# Patient Record
Sex: Male | Born: 1940 | Race: White | Hispanic: No | Marital: Married
Health system: Southern US, Community
[De-identification: ages and names within clinical notes are randomized; demographics above are authoritative.]

---

## 2020-01-21 ENCOUNTER — Inpatient Hospital Stay: Admit: 2020-01-21 | Primary: Internal Medicine

## 2020-03-17 ENCOUNTER — Ambulatory Visit
Admit: 2020-03-17 | Discharge: 2020-03-17 | Payer: MEDICARE | Attending: Cardiovascular Disease | Primary: Internal Medicine

## 2020-03-17 ENCOUNTER — Ambulatory Visit: Attending: Cardiovascular Disease | Primary: Internal Medicine

## 2020-03-17 DIAGNOSIS — I499 Cardiac arrhythmia, unspecified: Secondary | ICD-10-CM

## 2020-03-17 NOTE — Progress Notes (Signed)
[facility]  [address], [address]  [place] [address]  PHONE: [phone]    SUBJECTIVE: /HPI  Jerry S Dowse Jr. (07/30/1941) is a 79 y.o. male seen for a follow up visit regarding the following: Pt presents to the office c/o having "heart fluttering". Pt reports this has been a distant issue and is currently not having problems. States "feels like a muscle to me". Denies having any chest pain, shob.     Pt was seen by primary care for having L sided vision changes and R sided jaw discomfort, seen by [facility], got temporal artery biopsy. On prednisone 60 mg. Pt is concerned for carotid artery stenosis. Denies having numbness, focal weakness, slurred speech or facial droop.       ICD-10-CM ICD-9-CM   1. Irregular heart beat  I49.9 427.9   2. Palpitations  R00.2 785.1   3. Dyspnea on exertion  R06.00 786.09     Past Medical History, Past Surgical History, Family history, Social History, and Medications were all reviewed with the patient today and updated as necessary.    Outpatient Medications Marked as Taking for the 03/17/20 encounter (Office Visit) with Neelie Welshans M, MD   Medication Sig Dispense Refill   ??? predniSONE (DELTASONE) 20 mg tablet Take 60 mg by mouth daily.       No Known Allergies  No past medical history on file.  No past surgical history on file.  No family history on file.   Social History     Tobacco Use   ??? Smoking status: Former Smoker     Quit date: 1970     Years since quitting: 51.2   ??? Smokeless tobacco: Never Used   Substance Use Topics   ??? Alcohol use: Not on file     @JBFAM@    Current Outpatient Medications:   ???  predniSONE (DELTASONE) 20 mg tablet, Take 60 mg by mouth daily., Disp: , Rfl:         ROS:  Review of Systems - General ROS: negative for - chills, fatigue or fever  Hematological and Lymphatic ROS: negative for - bleeding problems, bruising or jaundice  Respiratory ROS: no cough, shortness of breath, or wheezing  Cardiovascular ROS: no chest pain or  dyspnea on exertion  Gastrointestinal ROS: no abdominal pain, change in bowel habits, or black or bloody stools  Neurological ROS: no TIA or stroke symptoms  All other systems negative.      Wt Readings from Last 3 Encounters:   03/17/20 146 lb (66.2 kg)     Temp Readings from Last 3 Encounters:   No data found for Temp     BP Readings from Last 3 Encounters:   03/17/20 (!) 140/78     Pulse Readings from Last 3 Encounters:   03/17/20 68           PHYSICAL EXAM:  Visit Vitals  BP (!) 140/78   Pulse 68   Ht 5' 8" (1.727 m)   Wt 146 lb (66.2 kg)   BMI 22.20 kg/m??       Physical Examination: General appearance - alert, well appearing, and in no distress  Mental status - alert, oriented to person, place, and time  Eyes - pupils equal and reactive, extraocular eye movements intact  Neck/lymph - supple, no significant adenopathy, no carotid bruit  Chest/lungs - clear to auscultation, no wheezes, rales or rhonchi, symmetric air entry  Heart/CV - normal rate, regular rhythm, normal S1,   S2, no murmurs, rubs, clicks or gallops  Abdomen/GI - soft, nontender, nondistended, no masses or organomegaly  Musculoskeletal - no joint tenderness, deformity or swelling  Extremities - peripheral pulses normal, no pedal edema, no clubbing or cyanosis  Skin - normal coloration and turgor, no rashes, no suspicious skin lesions noted    EKG: normal EKG, normal sinus rhythm, unchanged from previous tracings.                   Medications reviewed and questions answered    No results found for this or any previous visit (from the past 672 hour(s)).  No results found for: CHOL, CHOLPOCT, CHOLX, CHLST, CHOLV, HDL, HDLPOC, HDLP, LDL, LDLCPOC, LDLC, DLDLP, VLDLC, VLDL, TGLX, TRIGL, TRIGP, TGLPOCT, CHHD, CHHDX      ASSESSMENT and PLAN        1. Irregular heart beat  CBC, CMP, MG, TSH, echo, monitor.  - AMB POC EKG ROUTINE W/ 12 LEADS, INTER & REP    2. Vision changes,  -Currently on prednisone 60mg  by [facility], pending temporal artery biopsy, no  carotid bruit bt patient expressed concern for carotid artery stenosis, can perform carotid .     kardia mobile for palpitations  Stress echo for cp  Carotid US for CAS      Orders Placed This Encounter   ??? DOPPLER COLOR FLOW VELOCITY [place]     Standing Status:   Future     Standing Expiration Date:   09/13/2020   ??? DUPLEX CAROTID BILATERAL AMB NEURO     Standing Status:   Future     Standing Expiration Date:   03/18/2021     Order Specific Question:   Reason for Exam:     Answer:   cas   ??? STRESS ECHO ICLUD PERFORM ECG MONT WITH 03/20/2021     Standing Status:   Future     Standing Expiration Date:   03/17/2021     Order Specific Question:   Reason for Exam:     Answer:   See diagnosis   ??? DOPPLER ECHO HEART,[id number]     Standing Status:   Future     Standing Expiration Date:   09/13/2020     Order Specific Question:   Reason for Exam:     Answer:   See diagnosis   ??? AMB POC EKG ROUTINE W/ 12 LEADS, INTER & REP     Order Specific Question:   Reason for Exam:     Answer:   See diagnosis   ??? predniSONE (DELTASONE) 20 mg tablet     Sig: Take 60 mg by mouth daily.       Pt is instructed to follow all appropriate cardiac risk factor recommendations and to be compliant with meds and testing. Instructed to follow up appropriately and seek urgent medical care if acute or unstable issues arise. Results of some tests may be viewed thru MyChart but this does not substitute for follow up with MD. If follow up is not scheduled pt is instructed to call for follow up      Follow-up and Dispositions    ?? Return for after testing.               09/15/2020, MD  03/17/2020  11:17 AM

## 2020-03-17 NOTE — Progress Notes (Addendum)
[facility]  [address], [address]  [place] [address]  PHONE: [phone]    SUBJECTIVE: /HPI  Jerry S Mozley Jr. (04/06/1941) is a 79 y.o. male seen for a follow up visit regarding the following: Pt presents to the office c/o having "heart fluttering". Pt reports this has been a distant issue and is currently not having problems. States "feels like a muscle to me". Denies having any chest pain, shob.     Pt was seen by primary care for having L sided vision changes and R sided jaw discomfort, seen by [facility], got temporal artery biopsy. On prednisone 60 mg. Pt is concerned for carotid artery stenosis. Denies having numbness, focal weakness, slurred speech or facial droop.       ICD-10-CM ICD-9-CM   1. Irregular heart beat  I49.9 427.9   2. Palpitations  R00.2 785.1   3. Dyspnea on exertion  R06.00 786.09     Past Medical History, Past Surgical History, Family history, Social History, and Medications were all reviewed with the patient today and updated as necessary.    Outpatient Medications Marked as Taking for the 03/17/20 encounter (Office Visit) with Bittrick, Jon M, MD   Medication Sig Dispense Refill   ??? predniSONE (DELTASONE) 20 mg tablet Take 60 mg by mouth daily.       No Known Allergies  No past medical history on file.  No past surgical history on file.  No family history on file.   Social History     Tobacco Use   ??? Smoking status: Former Smoker     Quit date: 1970     Years since quitting: 51.2   ??? Smokeless tobacco: Never Used   Substance Use Topics   ??? Alcohol use: Not on file     @JBFAM@    Current Outpatient Medications:   ???  predniSONE (DELTASONE) 20 mg tablet, Take 60 mg by mouth daily., Disp: , Rfl:         ROS:  Review of Systems - General ROS: negative for - chills, fatigue or fever  Hematological and Lymphatic ROS: negative for - bleeding problems, bruising or jaundice  Respiratory ROS: no cough, shortness of breath, or wheezing  Cardiovascular ROS: no chest pain or  dyspnea on exertion  Gastrointestinal ROS: no abdominal pain, change in bowel habits, or black or bloody stools  Neurological ROS: no TIA or stroke symptoms  All other systems negative.      Wt Readings from Last 3 Encounters:   03/17/20 146 lb (66.2 kg)     Temp Readings from Last 3 Encounters:   No data found for Temp     BP Readings from Last 3 Encounters:   03/17/20 (!) 140/78     Pulse Readings from Last 3 Encounters:   03/17/20 68           PHYSICAL EXAM:  Visit Vitals  BP (!) 140/78   Pulse 68   Ht 5' 8" (1.727 m)   Wt 146 lb (66.2 kg)   BMI 22.20 kg/m??       Physical Examination: General appearance - alert, well appearing, and in no distress  Mental status - alert, oriented to person, place, and time  Eyes - pupils equal and reactive, extraocular eye movements intact  Neck/lymph - supple, no significant adenopathy, no carotid bruit  Chest/lungs - clear to auscultation, no wheezes, rales or rhonchi, symmetric air entry  Heart/CV - normal rate, regular rhythm, normal S1,   S2, no murmurs, rubs, clicks or gallops  Abdomen/GI - soft, nontender, nondistended, no masses or organomegaly  Musculoskeletal - no joint tenderness, deformity or swelling  Extremities - peripheral pulses normal, no pedal edema, no clubbing or cyanosis  Skin - normal coloration and turgor, no rashes, no suspicious skin lesions noted    EKG: normal EKG, normal sinus rhythm, unchanged from previous tracings.                   Medications reviewed and questions answered    No results found for this or any previous visit (from the past 672 hour(s)).  No results found for: CHOL, CHOLPOCT, CHOLX, CHLST, CHOLV, HDL, HDLPOC, HDLP, LDL, LDLCPOC, LDLC, DLDLP, VLDLC, VLDL, TGLX, TRIGL, TRIGP, TGLPOCT, CHHD, CHHDX      ASSESSMENT and PLAN        1. Irregular heart beat  CBC, CMP, MG, TSH, echo, monitor.  - AMB POC EKG ROUTINE W/ 12 LEADS, INTER & REP    2. Vision changes,  -Currently on prednisone 60mg  by [facility], pending temporal artery biopsy, no  carotid bruit bt patient expressed concern for carotid artery stenosis, can perform carotid .     kardia mobile for palpitations  Stress echo for cp  Carotid US for CAS      Orders Placed This Encounter   ??? DOPPLER COLOR FLOW VELOCITY [place]     Standing Status:   Future     Standing Expiration Date:   09/13/2020   ??? DUPLEX CAROTID BILATERAL AMB NEURO     Standing Status:   Future     Standing Expiration Date:   03/18/2021     Order Specific Question:   Reason for Exam:     Answer:   cas   ??? STRESS ECHO ICLUD PERFORM ECG [facility]ONT WITH 03/20/2021     Standing Status:   Future     Standing Expiration Date:   03/17/2021     Order Specific Question:   Reason for Exam:     Answer:   See diagnosis   ??? DOPPLER ECHO HEART,[id number]     Standing Status:   Future     Standing Expiration Date:   09/13/2020     Order Specific Question:   Reason for Exam:     Answer:   See diagnosis   ??? AMB POC EKG ROUTINE W/ 12 LEADS, INTER & REP     Order Specific Question:   Reason for Exam:     Answer:   See diagnosis   ??? predniSONE (DELTASONE) 20 mg tablet     Sig: Take 60 mg by mouth daily.       Pt is instructed to follow all appropriate cardiac risk factor recommendations and to be compliant with meds and testing. Instructed to follow up appropriately and seek urgent medical care if acute or unstable issues arise. Results of some tests may be viewed thru MyChart but this does not substitute for follow up with MD. If follow up is not scheduled pt is instructed to call for follow up      Follow-up and Dispositions    ?? Return for after testing.               09/15/2020, MD  03/17/2020  11:17 AM

## 2020-03-17 NOTE — Progress Notes (Signed)
 [facility]  [address], [address]  [place], [place] [address]  PHONE: [phone]    SUBJECTIVE: Jerry Mendoza. (01/21/1941) is a 79 y.o. male seen for a follow up visit regarding the following: Pt presents to the office c/o having heart fluttering. Pt reports this has been a distant issue and is currently not having problems. States feels like a muscle to me. Denies having any chest pain, shob.     Pt was seen by primary care for having L sided vision changes and R sided jaw discomfort, seen by [name], got temporal artery biopsy. On prednisone 60 mg. Pt is concerned for carotid artery stenosis. Denies having numbness, focal weakness, slurred speech or facial droop.       ICD-10-CM ICD-9-CM   1. Irregular heart beat  I49.9 427.9   2. Palpitations  R00.2 785.1   3. Dyspnea on exertion  R06.00 786.09     Past Medical History, Past Surgical History, Family history, Social History, and Medications were all reviewed with the patient today and updated as necessary.    Outpatient Medications Marked as Taking for the 03/17/20 encounter (Office Visit) with Bittrick, Jerry HERO, MD   Medication Sig Dispense Refill   . predniSONE (DELTASONE) 20 mg tablet Take 60 mg by mouth daily.       No Known Allergies  No past medical history on file.  No past surgical history on file.  No family history on file.   Social History     Tobacco Use   . Smoking status: Former Smoker     Quit date: 1970     Years since quitting: 51.2   . Smokeless tobacco: Never Used   Substance Use Topics   . Alcohol use: Not on file     @JBFAM @    Current Outpatient Medications:   .  predniSONE (DELTASONE) 20 mg tablet, Take 60 mg by mouth daily., Disp: , Rfl:         ROS:  Review of Systems - General ROS: negative for - chills, fatigue or fever  Hematological and Lymphatic ROS: negative for - bleeding problems, bruising or jaundice  Respiratory ROS: no cough, shortness of breath, or wheezing  Cardiovascular ROS: no chest pain or  dyspnea on exertion  Gastrointestinal ROS: no abdominal pain, change in bowel habits, or black or bloody stools  Neurological ROS: no TIA or stroke symptoms  All other systems negative.      Wt Readings from Last 3 Encounters:   03/17/20 146 lb (66.2 kg)     Temp Readings from Last 3 Encounters:   No data found for Temp     BP Readings from Last 3 Encounters:   03/17/20 (!) 140/78     Pulse Readings from Last 3 Encounters:   03/17/20 68           PHYSICAL EXAM:  Visit Vitals  BP (!) 140/78   Pulse 68   Ht 5' 8 (1.727 m)   Wt 146 lb (66.2 kg)   BMI 22.20 kg/m       Physical Examination: General appearance - alert, well appearing, and in no distress  Mental status - alert, oriented to person, place, and time  Eyes - pupils equal and reactive, extraocular eye movements intact  Neck/lymph - supple, no significant adenopathy, no carotid bruit  Chest/lungs - clear to auscultation, no wheezes, rales or rhonchi, symmetric air entry  Heart/CV - normal rate, regular rhythm, normal S1,  S2, no murmurs, rubs, clicks or gallops  Abdomen/GI - soft, nontender, nondistended, no masses or organomegaly  Musculoskeletal - no joint tenderness, deformity or swelling  Extremities - peripheral pulses normal, no pedal edema, no clubbing or cyanosis  Skin - normal coloration and turgor, no rashes, no suspicious skin lesions noted    EKG: normal EKG, normal sinus rhythm, unchanged from previous tracings.                   Medications reviewed and questions answered    No results found for this or any previous visit (from the past 672 hour(s)).  No results found for: CHOL, CHOLPOCT, CHOLX, CHLST, CHOLV, HDL, HDLPOC, HDLP, LDL, LDLCPOC, LDLC, DLDLP, VLDLC, VLDL, TGLX, TRIGL, TRIGP, TGLPOCT, CHHD, CHHDX      ASSESSMENT and PLAN        1. Irregular heart beat  CBC, CMP, MG, TSH, echo, monitor.  - AMB POC EKG ROUTINE W/ 12 LEADS, INTER & REP    2. Vision changes,  -Currently on prednisone 60mg  by [facility], pending temporal artery biopsy, no  carotid bruit bt patient expressed concern for carotid artery stenosis, can perform carotid US .     kardia mobile for palpitations  Stress echo for cp  Carotid US  for CAS      Orders Placed This Encounter   . DOPPLER COLOR FLOW VELOCITY [id number]     Standing Status:   Future     Standing Expiration Date:   09/13/2020   . DUPLEX CAROTID BILATERAL AMB NEURO     Standing Status:   Future     Standing Expiration Date:   03/18/2021     Order Specific Question:   Reason for Exam:     Answer:   cas   . STRESS ECHO ICLUD PERFORM ECG MONT WITH [id number]     Standing Status:   Future     Standing Expiration Date:   03/17/2021     Order Specific Question:   Reason for Exam:     Answer:   See diagnosis   . DOPPLER ECHO HEART,[id number]     Standing Status:   Future     Standing Expiration Date:   09/13/2020     Order Specific Question:   Reason for Exam:     Answer:   See diagnosis   . AMB POC EKG ROUTINE W/ 12 LEADS, INTER & REP     Order Specific Question:   Reason for Exam:     Answer:   See diagnosis   . predniSONE (DELTASONE) 20 mg tablet     Sig: Take 60 mg by mouth daily.       Pt is instructed to follow all appropriate cardiac risk factor recommendations and to be compliant with meds and testing. Instructed to follow up appropriately and seek urgent medical care if acute or unstable issues arise. Results of some tests may be viewed thru MyChart but this does not substitute for follow up with MD. If follow up is not scheduled pt is instructed to call for follow up      Follow-up and Dispositions     Return for after testing.               Jerry CHRISTELLA Parcel, MD  03/17/2020  11:17 AM

## 2020-03-17 NOTE — Progress Notes (Signed)
[facility]  [address], [address]  [place] [address]  PHONE: [phone]    SUBJECTIVE: /HPI  Jerry S Avilla Jr. (12/04/1941) is a 79 y.o. male seen for a follow up visit regarding the following: Pt presents to the office c/o having "heart fluttering". Pt reports this has been a distant issue and is currently not having problems. States "feels like a muscle to me". Denies having any chest pain, shob.     Pt was seen by primary care for having L sided vision changes and R sided jaw discomfort, seen by [facility], got temporal artery biopsy. On prednisone 60 mg. Pt is concerned for carotid artery stenosis. Denies having numbness, focal weakness, slurred speech or facial droop.       ICD-10-CM ICD-9-CM   1. Irregular heart beat  I49.9 427.9   2. Palpitations  R00.2 785.1   3. Dyspnea on exertion  R06.00 786.09     Past Medical History, Past Surgical History, Family history, Social History, and Medications were all reviewed with the patient today and updated as necessary.    Outpatient Medications Marked as Taking for the 03/17/20 encounter (Office Visit) with Codi Kertz M, MD   Medication Sig Dispense Refill   ??? predniSONE (DELTASONE) 20 mg tablet Take 60 mg by mouth daily.       No Known Allergies  No past medical history on file.  No past surgical history on file.  No family history on file.   Social History     Tobacco Use   ??? Smoking status: Former Smoker     Quit date: 1970     Years since quitting: 51.2   ??? Smokeless tobacco: Never Used   Substance Use Topics   ??? Alcohol use: Not on file     @JBFAM@    Current Outpatient Medications:   ???  predniSONE (DELTASONE) 20 mg tablet, Take 60 mg by mouth daily., Disp: , Rfl:         ROS:  Review of Systems - General ROS: negative for - chills, fatigue or fever  Hematological and Lymphatic ROS: negative for - bleeding problems, bruising or jaundice  Respiratory ROS: no cough, shortness of breath, or wheezing  Cardiovascular ROS: no chest pain or  dyspnea on exertion  Gastrointestinal ROS: no abdominal pain, change in bowel habits, or black or bloody stools  Neurological ROS: no TIA or stroke symptoms  All other systems negative.      Wt Readings from Last 3 Encounters:   03/17/20 146 lb (66.2 kg)     Temp Readings from Last 3 Encounters:   No data found for Temp     BP Readings from Last 3 Encounters:   03/17/20 (!) 140/78     Pulse Readings from Last 3 Encounters:   03/17/20 68           PHYSICAL EXAM:  Visit Vitals  BP (!) 140/78   Pulse 68   Ht 5' 8" (1.727 m)   Wt 146 lb (66.2 kg)   BMI 22.20 kg/m??       Physical Examination: General appearance - alert, well appearing, and in no distress  Mental status - alert, oriented to person, place, and time  Eyes - pupils equal and reactive, extraocular eye movements intact  Neck/lymph - supple, no significant adenopathy, no carotid bruit  Chest/lungs - clear to auscultation, no wheezes, rales or rhonchi, symmetric air entry  Heart/CV - normal rate, regular rhythm, normal S1,   S2, no murmurs, rubs, clicks or gallops  Abdomen/GI - soft, nontender, nondistended, no masses or organomegaly  Musculoskeletal - no joint tenderness, deformity or swelling  Extremities - peripheral pulses normal, no pedal edema, no clubbing or cyanosis  Skin - normal coloration and turgor, no rashes, no suspicious skin lesions noted    EKG: normal EKG, normal sinus rhythm, unchanged from previous tracings.                   Medications reviewed and questions answered    No results found for this or any previous visit (from the past 672 hour(s)).  No results found for: CHOL, CHOLPOCT, CHOLX, CHLST, CHOLV, HDL, HDLPOC, HDLP, LDL, LDLCPOC, LDLC, DLDLP, VLDLC, VLDL, TGLX, TRIGL, TRIGP, TGLPOCT, CHHD, CHHDX      ASSESSMENT and PLAN        1. Irregular heart beat  CBC, CMP, MG, TSH, echo, monitor.  - AMB POC EKG ROUTINE W/ 12 LEADS, INTER & REP    2. Vision changes,  -Currently on prednisone 60mg  by [facility], pending temporal artery biopsy, no  carotid bruit bt patient expressed concern for carotid artery stenosis, can perform carotid .     kardia mobile for palpitations  Stress echo for cp  Carotid US for CAS      Orders Placed This Encounter   ??? DOPPLER COLOR FLOW VELOCITY [place]     Standing Status:   Future     Standing Expiration Date:   09/13/2020   ??? DUPLEX CAROTID BILATERAL AMB NEURO     Standing Status:   Future     Standing Expiration Date:   03/18/2021     Order Specific Question:   Reason for Exam:     Answer:   cas   ??? STRESS ECHO ICLUD PERFORM ECG MONT WITH 03/20/2021     Standing Status:   Future     Standing Expiration Date:   03/17/2021     Order Specific Question:   Reason for Exam:     Answer:   See diagnosis   ??? DOPPLER ECHO HEART,[id number]     Standing Status:   Future     Standing Expiration Date:   09/13/2020     Order Specific Question:   Reason for Exam:     Answer:   See diagnosis   ??? AMB POC EKG ROUTINE W/ 12 LEADS, INTER & REP     Order Specific Question:   Reason for Exam:     Answer:   See diagnosis   ??? predniSONE (DELTASONE) 20 mg tablet     Sig: Take 60 mg by mouth daily.       Pt is instructed to follow all appropriate cardiac risk factor recommendations and to be compliant with meds and testing. Instructed to follow up appropriately and seek urgent medical care if acute or unstable issues arise. Results of some tests may be viewed thru MyChart but this does not substitute for follow up with MD. If follow up is not scheduled pt is instructed to call for follow up      Follow-up and Dispositions    ?? Return for after testing.               09/15/2020, MD  03/17/2020  11:17 AM

## 2020-03-18 ENCOUNTER — Encounter: Payer: MEDICARE | Primary: Internal Medicine

## 2020-03-18 ENCOUNTER — Institutional Professional Consult (permissible substitution): Admit: 2020-03-18 | Payer: MEDICARE | Primary: Internal Medicine

## 2020-03-18 ENCOUNTER — Institutional Professional Consult (permissible substitution): Primary: Internal Medicine

## 2020-03-18 DIAGNOSIS — I499 Cardiac arrhythmia, unspecified: Secondary | ICD-10-CM

## 2020-03-18 NOTE — Progress Notes (Signed)
Carotid ultrasound completed.  See interpretation scanned to the order.

## 2020-03-19 ENCOUNTER — Encounter: Payer: MEDICARE | Primary: Internal Medicine

## 2020-03-25 ENCOUNTER — Institutional Professional Consult (permissible substitution): Admit: 2020-03-25 | Payer: MEDICARE | Primary: Internal Medicine

## 2020-03-25 ENCOUNTER — Institutional Professional Consult (permissible substitution): Primary: Internal Medicine

## 2020-03-25 DIAGNOSIS — I499 Cardiac arrhythmia, unspecified: Secondary | ICD-10-CM

## 2020-03-25 MED ORDER — PERFLUTREN LIPID MICROSPHERES 1.1 MG/ML IV
1.1 mg/mL | Freq: Once | INTRAVENOUS | Status: AC
Start: 2020-03-25 — End: 2020-03-25
  Administered 2020-03-25: 20:00:00 via INTRAVENOUS

## 2020-03-25 NOTE — Progress Notes (Signed)
Full Study Stress Echo. See interpretation scanned to the order.

## 2020-04-02 ENCOUNTER — Encounter: Primary: Internal Medicine

## 2020-05-08 ENCOUNTER — Encounter: Primary: Internal Medicine

## 2020-05-14 ENCOUNTER — Encounter: Attending: Cardiovascular Disease | Primary: Internal Medicine

## 2020-05-25 ENCOUNTER — Ambulatory Visit
Admit: 2020-05-25 | Discharge: 2020-05-25 | Payer: MEDICARE | Attending: Cardiovascular Disease | Primary: Internal Medicine

## 2020-05-25 ENCOUNTER — Ambulatory Visit: Attending: Cardiovascular Disease | Primary: Internal Medicine

## 2020-05-25 DIAGNOSIS — I499 Cardiac arrhythmia, unspecified: Secondary | ICD-10-CM

## 2020-05-25 NOTE — Progress Notes (Signed)
[place], [place]  [address], [address]  [place] [address]  PHONE: [phone]    SUBJECTIVE: Jerry Mendoza. (1941/09/24) is a 79 y.o. male seen for a follow up visit regarding the following: Pt presents to the office c/o having "heart fluttering". Pt reports this has been a distant issue and is currently not having problems. States "feels like a muscle to me". Denies having any chest pain, shob.     Pt was seen by primary care for having L sided vision changes and R sided jaw discomfort, seen by [facility], got temporal artery biopsy. On prednisone 60 mg. Pt is concerned for carotid artery stenosis. Denies having numbness, focal weakness, slurred speech or facial droop.  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 Irregular heart beat  I49.9 427.9   2. Palpitations  R00.2 785.1   3. Dyspnea on exertion  R06.00 786.09     Past Medical History, Past Surgical History, Family history, Social History, and Medications were all reviewed with the patient today and updated as necessary.  Patient has minimal plaque bilaterally on his carotid artery duplex baseline echo has normal LV function normal diastolic filling probably has a very small PFO stress portion of the echocardiogram is negative for inducible ischemia    Outpatient Medications Marked as Taking for the 05/25/20 encounter (Office Visit) with Darien Mignogna, Orland Penman, MD   Medication Sig Dispense Refill   ??? predniSONE (DELTASONE) 20 mg tablet Take as directed       No Known Allergies  No past medical history on file.  No past surgical history on file.  No family history on file.   Social History     Tobacco Use   ??? Smoking status: Former Smoker      Quit date: 1970     Years since quitting: 51.4   ??? Smokeless tobacco: Never Used   Substance Use Topics   ??? Alcohol use: Not on file     @JBFAM @    Current Outpatient Medications:   ???  predniSONE (DELTASONE) 20 mg tablet, Take as directed, Disp: , Rfl:         ROS:  Review of Systems - General ROS: negative for - chills, fatigue or fever  Hematological and Lymphatic ROS: negative for - bleeding problems, bruising or jaundice  Respiratory ROS: no cough, shortness of breath, or wheezing  Cardiovascular ROS: no chest pain or dyspnea on exertion  Gastrointestinal ROS: no abdominal pain, change in bowel habits, or black or bloody stools  Neurological ROS: no TIA or stroke symptoms  All other systems negative.      Wt Readings from Last 3 Encounters:   05/25/20 136 lb (61.7 kg)   03/17/20 146 lb (66.2 kg)     Temp Readings from Last 3 Encounters:   No data found for Temp     BP Readings from Last 3 Encounters:   05/25/20 130/66   03/17/20 (!) 140/78     Pulse Readings from Last 3 Encounters:   05/25/20 76   03/17/20 68           PHYSICAL EXAM:  Visit Vitals  BP 130/66   Pulse 76   Ht 5\' 8"  (1.727 m)   Wt 136 lb (61.7 kg)   BMI 20.68 kg/m??       Physical Examination: General appearance - alert, well appearing, and in no distress  Mental status - alert, oriented to person, place, and time  Eyes - pupils equal and reactive, extraocular eye movements intact  Neck/lymph - supple, no significant adenopathy, no carotid bruit  Chest/lungs - clear to auscultation, no wheezes, rales or rhonchi, symmetric air entry  Heart/CV - normal rate, regular rhythm, normal S1, S2, no murmurs, rubs, clicks or gallops  Abdomen/GI - soft, nontender, nondistended, no masses or organomegaly  Musculoskeletal - no joint tenderness, deformity or swelling  Extremities - peripheral pulses normal, no pedal edema, no clubbing or cyanosis  Skin - normal coloration and turgor, no rashes, no suspicious skin lesions noted    EKG: normal EKG, normal sinus  rhythm, unchanged from previous tracings.                   Medications reviewed and questions answered    No results found for this or any previous visit (from the past 672 hour(s)).  No results found for: CHOL, CHOLPOCT, CHOLX, CHLST, CHOLV, HDL, HDLPOC, HDLP, LDL, LDLCPOC, LDLC, DLDLP, VLDLC, VLDL, TGLX, TRIGL, TRIGP, TGLPOCT, CHHD, CHHDX      ASSESSMENT and PLAN        1. Irregular heart beat  CBC, CMP, MG, TSH, echo, monitor.  - AMB POC EKG ROUTINE W/ 12 LEADS, INTER & REP    2. Vision changes,  -Currently on prednisone 60mg  by [facility], pending temporal artery biopsy, no carotid bruit bt patient expressed concern for carotid artery stenosis, can perform carotid .        Patient appears to have non cardiac symptoms. No further cardiac workup indicated. Continue current meds and follow appropriate risk factor modifications and healthy lifestyle and dietary choices.      No orders of the defined types were placed in this encounter.      Pt is instructed to follow all appropriate cardiac risk factor recommendations and to be compliant with meds and testing. Instructed to follow up appropriately and seek urgent medical care if acute or unstable issues arise. Results of some tests may be viewed thru MyChart but this does not substitute for follow up with MD. If follow up is not scheduled pt is instructed to call for follow up                , MD  05/25/2020  11:17 AM

## 2020-12-01 ENCOUNTER — Other Ambulatory Visit: Payer: Self-pay

## 2020-12-01 ENCOUNTER — Ambulatory Visit: Payer: Medicare Other | Attending: Internal Medicine | Admitting: Physical Therapy

## 2020-12-01 ENCOUNTER — Encounter: Payer: Self-pay | Admitting: Physical Therapy

## 2020-12-01 DIAGNOSIS — M25612 Stiffness of left shoulder, not elsewhere classified: Secondary | ICD-10-CM

## 2020-12-01 DIAGNOSIS — M6281 Muscle weakness (generalized): Secondary | ICD-10-CM | POA: Diagnosis not present

## 2020-12-01 NOTE — Therapy (Signed)
[facility] [address], [address] [place], [place], [address] Phone: [phone]   Fax:  [phone]  Physical Therapy Evaluation  Patient Details  Name: Cory Richard. MRN: [id number] Date of Birth: [birth year] Referring Provider (PT): Renford Dills MD   Encounter Date: 12/01/2020   PT End of Session - 12/01/20 1146    Visit Number 1    Date for PT Re-Evaluation 01/26/21    Authorization Type MCR    Progress Note Due on Visit 10    PT Start Time 1146    PT Stop Time 1233    PT Time Calculation (min) 47 min    Activity Tolerance Patient tolerated treatment well    Behavior During Therapy [facility] for tasks assessed/performed           History reviewed. No pertinent past medical history.  History reviewed. No pertinent surgical history.  There were no vitals filed for this visit.    Subjective Assessment - 12/01/20 1151    Subjective About 4 months ago saw MD for left shoulder and was told the ball was trying to come out of the socket when he raised his arm OH. Additionally he is here for overall weakness. 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 Now is at 2 mg/day. Methotrexate 1x/week. 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 Taking Calcium and vit D3 also. He went off his OP meds due to back pain it caused. His biggest c/o is trying to perform OH ADLs. Still walks weekly. He gets occassional pain with moving shoulders but it's mainly weakness.    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    Currently in Pain? No/denies              [facility] PT Assessment - 12/01/20 0001      Assessment   Medical Diagnosis physical deconditioning    Referring Provider (PT) Renford Dills MD    Onset Date/Surgical Date 05/19/20    Hand  Dominance Right    Next MD Visit March 2022    Prior Therapy for left RC      Precautions   Precautions Other (comment)    Precaution Comments osteoporosis      Restrictions   Weight Bearing Restrictions No      Balance Screen   Has the patient fallen in the past 6 months No    Has the patient had a decrease in activity level because of a fear of falling?  No    Is the patient reluctant to leave their home because of a fear of falling?  No      Home Tourist information centre manager residence    Living Arrangements Spouse/significant other      Prior Function   Level of Independence Independent    Vocation Retired    Leisure gym, walking, would like to get back to Training and development officer Comments bil winging scapula Rt > Lt, depressed right scapula/tight right lumbar      ROM / Strength   AROM / PROM / Strength AROM;PROM;Strength      AROM   Overall AROM Comments left cervical rotaton and bil SB tight but functional    AROM Assessment Site Shoulder  Right/Left Shoulder Right;Left    Right Shoulder Extension --   full   Right Shoulder Flexion 155 Degrees   standing   Right Shoulder ABduction --   [facility]   Right Shoulder Internal Rotation --   able to reach behind back without difficulty; WFL in supine   Right Shoulder External Rotation 90 Degrees    Left Shoulder Extension --   [facility]   Left Shoulder Flexion 145 Degrees   standing   Left Shoulder ABduction --   [facility]   Left Shoulder Internal Rotation --   able to reach behind back without difficulty; WFL in supine   Left Shoulder External Rotation 90 Degrees      PROM   PROM Assessment Site Shoulder    Right/Left Shoulder Right;Left    Right Shoulder Flexion 170 Degrees   standing   Left Shoulder Flexion 165 Degrees   standing     Strength   Overall Strength Comments bil hip flex 4+/5, right hip ext and ABD 5/5, left ext and ABD 4+/5    Strength Assessment Site Shoulder    Right/Left  Shoulder Right;Left    Right Shoulder Flexion 5/5    Right Shoulder Extension 5/5    Right Shoulder ABduction 4-/5    Right Shoulder Internal Rotation 4/5    Right Shoulder External Rotation 4-/5    Left Shoulder Flexion 5/5    Left Shoulder Extension 5/5    Left Shoulder ABduction 4-/5    Left Shoulder Internal Rotation 4/5    Left Shoulder External Rotation 3+/5      Flexibility   Soft Tissue Assessment /Muscle Length --   tight pectorals     Palpation   Palpation comment unremarkable      Special Tests   Other special tests neg special tests                      Objective measurements completed on examination: See above findings.       [facility] Adult PT Treatment/Exercise - 12/01/20 0001      Exercises   Exercises Shoulder      Shoulder Exercises: Supine   Horizontal ABduction Both;10 reps    Theraband Level (Shoulder Horizontal ABduction) Level 3 (Green)      Shoulder Exercises: Seated   External Rotation Both;10 reps      Shoulder Exercises: Standing   Extension 10 reps    Theraband Level (Shoulder Extension) Level 3 (Green)    Row 10 reps    Theraband Level (Shoulder Row) Level 3 Chilton Si)                  PT Education - 12/01/20 1238    Education Details HEP    Person(s) Educated Patient    Methods Explanation;Demonstration;Handout    Comprehension Verbalized understanding;Returned demonstration            PT Short Term Goals - 12/01/20 1253      PT SHORT TERM GOAL #1   Title Ind with initial HEP    Time 3    Period Weeks    Status New    Target Date 12/22/20      PT SHORT TERM GOAL #2   Title Pt to understand and verbalize importance of weightbearing activities in the treatment of osteoporosis.    Time 3    Period Weeks    Status New             PT Long Term Goals -  12/01/20 1253      PT LONG TERM GOAL #1   Title Improved bil shoulder strength to 4+/5 or better to normalize ADLS    Time 8    Period Weeks     Status New    Target Date 01/26/21      PT LONG TERM GOAL #2   Title Patient to report overall improvement of strength with ADLS by 75% or more.    Time 8    Period Weeks    Status New      PT LONG TERM GOAL #3   Title improved bil hip strength to 5/5 to improve function.    Time 8    Period Weeks    Status New      PT LONG TERM GOAL #4   Title ind with HEP to maintain strength gains in therapy.    Time 8    Period Weeks    Status New      PT LONG TERM GOAL #5   Title improved left shoulder flexion in standing to 155 deg or greater to ease OH activities.    Time 8    Period Weeks    Status New                  Plan - 12/01/20 1238    Clinical Impression Statement Patient presents with c/o of bil shoulder weakness since June 2021 limiting ADLS and overall weakness since being diagnosed with giant cell arteritis in April 2021. He does report occasional pain. He has also been recently diagnosed with osteoporosis he believes from the Prednisone regimen he's been on since April. Bil shoulders are WFL, but active left shoulder flexion in standing is only 145 deg. Bil shoulder strength is significantly limited in ER and ABD as well. He also has weakkness in bil hips left > right. Posturally, he has bil winging of his scapula indicating weakness and depression of right shoulder and scapula. He will benefit from PT to address these deficits.    Personal Factors and Comorbidities Comorbidity 2;Comorbidity 3+    Comorbidities giant cell arteritis, OP, Rt RCR    Examination-Activity Limitations Lift;Reach Overhead    Stability/Clinical Decision Making Stable/Uncomplicated    Rehab Potential Excellent    PT Frequency 2x / week    PT Duration 8 weeks    PT Treatment/Interventions ADLs/Self Care Home Management;Cryotherapy;Moist Heat;Therapeutic activities;Therapeutic exercise;Neuromuscular re-education;Manual techniques;Patient/family education;Dry needling    PT Next Visit Plan Bil  shoulder strengthening, serratus ant and mid/low trap strength, hip strengthening; osteoporosis ed    PT Home Exercise Plan [email or web address]    Consulted and Agree with Plan of Care Patient           Patient will benefit from skilled therapeutic intervention in order to improve the following deficits and impairments:  Decreased range of motion,Postural dysfunction,Decreased strength,Impaired flexibility,Impaired UE functional use  Visit Diagnosis: Muscle weakness (generalized) - Plan: PT plan of care cert/re-cert  Stiffness of left shoulder, not elsewhere classified - Plan: PT plan of care cert/re-cert     Problem List There are no problems to display for this patient.   Raynelle Fanning Deago Burruss PT 12/01/2020, 1:10 PM  [facility] [address] [place], [place], [address] Phone: [phone]   Fax:  [phone]  Name: Cory Richard. MRN: [id number] Date of Birth: 20-Sep-1941

## 2020-12-01 NOTE — Patient Instructions (Signed)
Access Code: [email or web address] URL: [email or web address] Date: 12/01/2020 Prepared by: Raynelle Fanning  Exercises Seated Shoulder External Rotation - 1 x daily - 7 x weekly - 3 sets - 10 reps Standing Bilateral Low Shoulder Row with Anchored Resistance - 1 x daily - 7 x weekly - 3 sets - 10 reps Shoulder Extension with Resistance - 1 x daily - 7 x weekly - 3 sets - 10 reps Supine Shoulder Horizontal Abduction with Resistance - 1 x daily - 7 x weekly - 3 sets - 10 reps

## 2020-12-07 ENCOUNTER — Encounter: Payer: Self-pay | Admitting: Physical Therapy

## 2020-12-09 ENCOUNTER — Encounter: Payer: Self-pay | Admitting: Physical Therapy

## 2020-12-14 ENCOUNTER — Ambulatory Visit: Payer: Medicare Other

## 2020-12-14 ENCOUNTER — Other Ambulatory Visit: Payer: Self-pay

## 2020-12-14 DIAGNOSIS — M6281 Muscle weakness (generalized): Secondary | ICD-10-CM

## 2020-12-14 DIAGNOSIS — M25612 Stiffness of left shoulder, not elsewhere classified: Secondary | ICD-10-CM

## 2020-12-14 NOTE — Therapy (Signed)
[facility] [address] [place], [place], [address] Phone: [phone]   Fax:  [phone]  Physical Therapy Treatment  Patient Details  Name: Cory Richard. MRN: [id number] Date of Birth: 1941-08-10 Referring Provider (PT): Renford Dills MD   Encounter Date: 12/14/2020   PT End of Session - 12/14/20 1015    Visit Number 2    Date for PT Re-Evaluation 01/26/21    Authorization Type MCR    Progress Note Due on Visit 10    PT Start Time 0930    PT Stop Time 1013    PT Time Calculation (min) 43 min    Activity Tolerance Patient tolerated treatment well    Behavior During Therapy [facility] for tasks assessed/performed           History reviewed. No pertinent past medical history.  History reviewed. No pertinent surgical history.  There were no vitals filed for this visit.                      [facility] Adult PT Treatment/Exercise - 12/14/20 0001      Exercises   Exercises Shoulder      Shoulder Exercises: Supine   Horizontal ABduction Both;20 reps    Theraband Level (Shoulder Horizontal ABduction) Level 3 (Green)    External Rotation Strengthening;Both;Theraband;10 reps    Theraband Level (Shoulder External Rotation) Level 1 (Yellow)    External Rotation Limitations stabilize with Lt and ER with Rt.  Not enough Lt shoulder strength for this active motion      Shoulder Exercises: Seated   External Rotation Both;10 reps      Shoulder Exercises: Sidelying   External Rotation Strengthening;Left;20 reps      Shoulder Exercises: Standing   Extension 20 reps;Theraband    Theraband Level (Shoulder Extension) Level 3 (Green)    Row 20 reps;Theraband    Theraband Level (Shoulder Row) Level 3 (Green)      Shoulder Exercises: ROM/Strengthening   UBE (Upper Arm Bike) Level 1x7 minutes (forward and reverse) PT present to discuss progress                  PT Education - 12/14/20 1007     Education Details Access Code: [email or web address], osteoporosis education    Person(s) Educated Patient    Methods Explanation;Demonstration;Handout    Comprehension Returned demonstration;Verbalized understanding            PT Short Term Goals - 12/01/20 1253      PT SHORT TERM GOAL #1   Title Ind with initial HEP    Time 3    Period Weeks    Status New    Target Date 12/22/20      PT SHORT TERM GOAL #2   Title Pt to understand and verbalize importance of weightbearing activities in the treatment of osteoporosis.    Time 3    Period Weeks    Status New             PT Long Term Goals - 12/01/20 1253      PT LONG TERM GOAL #1   Title Improved bil shoulder strength to 4+/5 or better to normalize ADLS    Time 8    Period Weeks    Status New    Target Date 01/26/21      PT LONG TERM GOAL #2   Title Patient to report overall improvement of strength with ADLS by 75% or more.  Time 8    Period Weeks    Status New      PT LONG TERM GOAL #3   Title improved bil hip strength to 5/5 to improve function.    Time 8    Period Weeks    Status New      PT LONG TERM GOAL #4   Title ind with HEP to maintain strength gains in therapy.    Time 8    Period Weeks    Status New      PT LONG TERM GOAL #5   Title improved left shoulder flexion in standing to 155 deg or greater to ease OH activities.    Time 8    Period Weeks    Status New                 Plan - 12/14/20 8341    Clinical Impression Statement Pt with first time follow-up after evaluation and reports that he has not started with his HEP yet.  PT spent session reviewing HEP with tactile and verbal cues for technique.  Pt tolerated endurance, strength and stability of the UEs today without fatigue. Pt required minor verbal and tactile cueing for scapular position with exercise.  PT provided osteoporosis education regarding dos and dont's.  Pt verbalized understanding.   Pt will continue to benefit from comprehensive  UE strength to address progressive weakness.    PT Frequency 2x / week    PT Duration 8 weeks    PT Treatment/Interventions ADLs/Self Care Home Management;Cryotherapy;Moist Heat;Therapeutic activities;Therapeutic exercise;Neuromuscular re-education;Manual techniques;Patient/family education;Dry needling    PT Next Visit Plan Bil shoulder strengthening, serratus ant and mid/low trap strength, hip strengthening; osteoporosis ed    PT Home Exercise Plan [email or web address]    Consulted and Agree with Plan of Care Patient           Patient will benefit from skilled therapeutic intervention in order to improve the following deficits and impairments:  Decreased range of motion,Postural dysfunction,Decreased strength,Impaired flexibility,Impaired UE functional use  Visit Diagnosis: Muscle weakness (generalized)  Stiffness of left shoulder, not elsewhere classified     Problem List There are no problems to display for this patient.   [name], PT 12/14/20 10:20 AM  [facility] [address] [place], [place], [address] Phone: [phone]   Fax:  [phone]  Name: Cory Richard. MRN: [id number] Date of Birth: March 30, 1941

## 2020-12-14 NOTE — Patient Instructions (Addendum)
Access Code: [email or web address] URL: [email or web address] Date: 12/14/2020 Prepared by: Tresa Endo  Exercises Seated Shoulder External Rotation - 1 x daily - 7 x weekly - 3 sets - 10 reps Standing Bilateral Low Shoulder Row with Anchored Resistance - 1 x daily - 7 x weekly - 3 sets - 10 reps Shoulder Extension with Resistance - 1 x daily - 7 x weekly - 3 sets - 10 reps Supine Shoulder Horizontal Abduction with Resistance - 1 x daily - 7 x weekly - 3 sets - 10 reps Wall Push Up - 2 x daily - 7 x weekly - 2 sets - 10 reps Sidelying Shoulder External Rotation AROM - 2 x daily - 7 x weekly - 2 sets - 10 reps                                             DO's and DON'T's   Avoid and/or Minimize positions of forward bending ( flexion)  Side bending and rotation of the trunk  Especially when movements occur together   When your back aches:   Don't sit down   Lie down on your back with a small pillow under your head and one under your knees or as outlined by our therapist. Or, lie in the 90/90 position ( on the floor with your feet and legs on the sofa with knees and hips bent to 90 degrees)  Tying or putting on your shoes:   Don't bend over to tie your shoes or put on socks.  Instead, bring one foot up, cross it over the opposite knee and bend forward (hinge) at the hips to so the task.  Keep your back straight.  If you cannot do this safely, then you need to use long handled assistive devices such as a shoehorn and sock puller.  Exercising:  Don't engage in ballistic types of exercise routines such as high-impact aerobics or jumping rope  Don't do exercises in the gym that bring you forward (abdominal crunches, sit-ups, touching your  toes, knee-to-chest, straight leg raising.)  Follow a regular exercise program that includes a variety of different weight-bearing activities, such as low-impact aerobics, T' ai chi or walking as your physical therapist advises  Do exercises that  emphasize return to normal body alignment and strengthening of the muscles that keep your back straight, as outlined in this program or by your therapist  Household tasks:  Don't reach unnecessarily or twist your trunk when mopping, sweeping, vacuuming, raking, making beds, weeding gardens, getting objects ou of cupboards, etc.  Keep your broom, mop, vacuum, or rake close to you and mover your whole body as you move them. Walk over to the area on which you are working. Arrange kitchen, bathroom, and bedroom shelves so that frequently used items may be reached without excessive bending, twisting, and reaching.  Use a sturdy stool if necessary.  Don't bend from the waist to pick up something up  Off the floor, out of the trunk of your car, or to brush your teeth, wash your face, etc.   Bend at the knees, keeping back straight as possible. Use a reacher if necessary.   Prevention of fracture is the so-called "BOTTOm -Line" in the management of OSTEOPOROSIS. Do not take unnecessary chances in movement. Once a compression fracture occurs, the process is very difficult to control; one fracture is frequently followed by many  more.

## 2020-12-16 ENCOUNTER — Other Ambulatory Visit: Payer: Self-pay

## 2020-12-16 ENCOUNTER — Ambulatory Visit: Payer: Medicare Other | Admitting: Physical Therapy

## 2020-12-16 ENCOUNTER — Encounter: Payer: Self-pay | Admitting: Physical Therapy

## 2020-12-16 DIAGNOSIS — M6281 Muscle weakness (generalized): Secondary | ICD-10-CM | POA: Diagnosis not present

## 2020-12-16 DIAGNOSIS — M25612 Stiffness of left shoulder, not elsewhere classified: Secondary | ICD-10-CM

## 2020-12-16 NOTE — Therapy (Signed)
[facility] [address] [place], [place], [address] Phone: [phone]   Fax:  [phone]  Physical Therapy Treatment  Patient Details  Name: Cory Richard. MRN: [id number] Date of Birth: [birth year] Referring Provider (PT): Renford Dills MD   Encounter Date: 12/16/2020   PT End of Session - 12/16/20 0843    Visit Number 3    Date for PT Re-Evaluation 01/26/21    Authorization Type MCR    Progress Note Due on Visit 10    PT Start Time 0842    PT Stop Time 0925    PT Time Calculation (min) 43 min    Activity Tolerance Patient tolerated treatment well    Behavior During Therapy [facility] for tasks assessed/performed           History reviewed. No pertinent past medical history.  History reviewed. No pertinent surgical history.  There were no vitals filed for this visit.   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 No/denies    Multiple Pain Sites No                             [facility] Adult PT Treatment/Exercise - 12/16/20 0001      Shoulder Exercises: Supine   Other Supine Exercises serratus punch 2# 2x10   Bil   Other Supine Exercises Shoulder circles with 2# maintaining retracted shoulder blade 10x each direction      Shoulder Exercises: Seated   External Rotation Limitations Green loop iosmetric like 5 sec hold 10x    Flexion Strengthening;Both;10 reps;Weights    Flexion Weight (lbs) 1    Abduction Strengthening;Both;10 reps;Weights    ABduction Weight (lbs) 1    ABduction Limitations Scaption 1# 10x bil      Shoulder Exercises: Sidelying   External Rotation --   1# with min asst by PTA 2x10 Bl     Shoulder Exercises: Standing   Extension Strengthening;Both;15 reps;Theraband    Theraband Level (Shoulder Extension)  Level 4 (Blue)    Row Strengthening;Both;15 reps;Theraband    Theraband Level (Shoulder Row) Level 4 (Blue)    Other Standing Exercises Small ball  CKC circles on wall 10x each direction bil      Shoulder Exercises: ROM/Strengthening   UBE (Upper Arm Bike) L1 3x3 with PTA present to monitor and discuss status    Lat Pull --   1 plate [id number]   Wall Pushups 20 reps                    PT Short Term Goals - 12/01/20 1253      PT SHORT TERM GOAL #1   Title Ind with initial HEP    Time 3    Period Weeks    Status New    Target Date 12/22/20      PT SHORT TERM GOAL #2   Title Pt to understand and verbalize importance of weightbearing activities in the treatment of osteoporosis.    Time 3    Period Weeks    Status New             PT Long Term Goals - 12/01/20 1253      PT LONG TERM GOAL #1  Title Improved bil shoulder strength to 4+/5 or better to normalize ADLS    Time 8    Period Weeks    Status New    Target Date 01/26/21      PT LONG TERM GOAL #2   Title Patient to report overall improvement of strength with ADLS by 75% or more.    Time 8    Period Weeks    Status New      PT LONG TERM GOAL #3   Title improved bil hip strength to 5/5 to improve function.    Time 8    Period Weeks    Status New      PT LONG TERM GOAL #4   Title ind with HEP to maintain strength gains in therapy.    Time 8    Period Weeks    Status New      PT LONG TERM GOAL #5   Title improved left shoulder flexion in standing to 155 deg or greater to ease OH activities.    Time 8    Period Weeks    Status New                 Plan - 12/16/20 0844    Clinical Impression Statement Pt arrives pain free. Session focused on UE strength and loading skeleton in order to promote bone hardening. Pt could complete all exercises with  min verbal cuing to relax upper trap and stabilize with his RTC more. No pain with exercises just an expected fatigue. PT requires assistance with  external rotation in order to not compensate.    Personal Factors and Comorbidities Comorbidity 2;Comorbidity 3+    Comorbidities giant cell arteritis, OP, Rt RCR    Examination-Activity Limitations Lift;Reach Overhead    Stability/Clinical Decision Making Stable/Uncomplicated    Rehab Potential Excellent    PT Frequency 2x / week    PT Duration 8 weeks    PT Treatment/Interventions ADLs/Self Care Home Management;Cryotherapy;Moist Heat;Therapeutic activities;Therapeutic exercise;Neuromuscular re-education;Manual techniques;Patient/family education;Dry needling    PT Next Visit Plan Bil shoulder strengthening, serratus ant and mid/low trap strength, hip strengthening; osteoporosis ed    PT Home Exercise Plan [email or web address]    Consulted and Agree with Plan of Care Patient           Patient will benefit from skilled therapeutic intervention in order to improve the following deficits and impairments:  Decreased range of motion,Postural dysfunction,Decreased strength,Impaired flexibility,Impaired UE functional use  Visit Diagnosis: Muscle weakness (generalized)  Stiffness of left shoulder, not elsewhere classified     Problem List There are no problems to display for this patient.   Cory Richard, PTA 12/16/2020, 9:25 AM  [facility] [address] [place], [place], [address] Phone: [phone]   Fax:  [phone]  Name: Cory Richard. MRN: [id number] Date of Birth: [birth year]

## 2020-12-22 ENCOUNTER — Other Ambulatory Visit: Payer: Self-pay

## 2020-12-22 ENCOUNTER — Ambulatory Visit: Payer: Medicare Other | Attending: Internal Medicine | Admitting: Physical Therapy

## 2020-12-22 ENCOUNTER — Encounter: Payer: Self-pay | Admitting: Physical Therapy

## 2020-12-22 DIAGNOSIS — M25612 Stiffness of left shoulder, not elsewhere classified: Secondary | ICD-10-CM | POA: Insufficient documentation

## 2020-12-22 DIAGNOSIS — M6281 Muscle weakness (generalized): Secondary | ICD-10-CM | POA: Diagnosis present

## 2020-12-22 NOTE — Patient Instructions (Signed)
Access Code: [email or web address] URL: [email or web address] Date: 12/22/2020 Prepared by: Loistine Simas Rand Boller  Exercises Seated Shoulder External Rotation - 1 x daily - 7 x weekly - 3 sets - 10 reps Standing Bilateral Low Shoulder Row with Anchored Resistance - 1 x daily - 7 x weekly - 3 sets - 10 reps Shoulder Extension with Resistance - 1 x daily - 7 x weekly - 3 sets - 10 reps Supine Shoulder Horizontal Abduction with Resistance - 1 x daily - 7 x weekly - 3 sets - 10 reps Wall Push Up - 2 x daily - 7 x weekly - 2 sets - 10 reps Sidelying Shoulder External Rotation AROM - 2 x daily - 7 x weekly - 2 sets - 10 reps Standing Hip Extension with Counter Support - 1 x daily - 7 x weekly - 2 sets - 10 reps Standing Hip Abduction with Unilateral Counter Support - 1 x daily - 7 x weekly - 2 sets - 10 reps Standing Hip Flexion with Counter Support - 1 x daily - 7 x weekly - 2 sets - 10 reps Supine Active Straight Leg Raise - 1 x daily - 7 x weekly - 2 sets - 10 reps Runner's Climb - 1 x daily - 7 x weekly - 2 sets - 10 reps Squat with Chair Touch - 1 x daily - 7 x weekly - 3 sets - 10 reps

## 2020-12-22 NOTE — Therapy (Signed)
[facility] [address] [place], [place], [address] Phone: [phone]   Fax:  [phone]  Physical Therapy Treatment  Patient Details  Name: Cory Richard. MRN: [id number] Date of Birth: 01-31-1941 Referring Provider (PT): Seward Carol MD   Encounter Date: 12/22/2020   PT End of Session - 12/22/20 1153    Visit Number 4    Date for PT Re-Evaluation 01/26/21    Authorization Type MCR    Progress Note Due on Visit 10    PT Start Time 4403    PT Stop Time 1228    PT Time Calculation (min) 43 min    Activity Tolerance Patient tolerated treatment well    Behavior During Therapy [facility] for tasks assessed/performed           History reviewed. No pertinent past medical history.  History reviewed. No pertinent surgical history.  There were no vitals filed for this visit.   Subjective Assessment - 12/22/20 1147    Subjective I joined a gym and am supplementing my HEP with their equipment.  No pain today.  I'm interested in adding in my hip strength today.    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    Currently in Pain? No/denies                             [facility] Adult PT Treatment/Exercise - 12/22/20 0001      Exercises   Exercises Shoulder;Lumbar;Knee/Hip      Knee/Hip Exercises: Standing   Hip Flexion Stengthening;Both;1 set;10 reps;Knee straight    Hip Flexion Limitations at counter    Hip Abduction Stengthening;Both;1 set;10 reps;Knee straight    Abduction Limitations at ocunter    Hip Extension Stengthening;Both;1 set;10 reps;Knee straight    Extension Limitations at counter    Forward Step Up Both;1 set;15 reps;Step Height: 6";Hand Hold: 1      Knee/Hip Exercises: Seated   Sit to Sand Other (comment);1 set;10 reps;without UE support   squat to mat  table touch x 10, VCs for hip hinge     Knee/Hip Exercises: Supine   Straight Leg Raises Strengthening;Both;1 set;10 reps      Shoulder Exercises: Standing   External Rotation Strengthening;Both;Theraband    External Rotation Limitations red loop tied for bil shoulder isom 5x5 sec, also A/ROM Lt only in SL x 10 reps    Internal Rotation Limitations 15lb pulleys x 10 Lt    Extension Limitations 15lb pulleys x 10 bil    Row Limitations 15lb pulleys x 10 bil      Shoulder Exercises: ROM/Strengthening   UBE (Upper Arm Bike) L1.5 3x3 PT present to discuss progress and HEP/gym ther ex                  PT Education - 12/22/20 1228    Education Details added hip strength/LE strength    Person(s) Educated Patient    Methods Explanation;Demonstration;Handout    Comprehension Verbalized understanding;Returned demonstration            PT Short Term Goals - 12/22/20 1154      PT SHORT TERM GOAL #1   Title Ind with initial HEP    Baseline met for shoulders, added hips today 12/22/20    Status Achieved  PT SHORT TERM GOAL #2   Title Pt to understand and verbalize importance of weightbearing activities in the treatment of osteoporosis.    Status Achieved             PT Long Term Goals - 12/01/20 1253      PT LONG TERM GOAL #1   Title Improved bil shoulder strength to 4+/5 or better to normalize ADLS    Time 8    Period Weeks    Status New    Target Date 01/26/21      PT LONG TERM GOAL #2   Title Patient to report overall improvement of strength with ADLS by 16% or more.    Time 8    Period Weeks    Status New      PT LONG TERM GOAL #3   Title improved bil hip strength to 5/5 to improve function.    Time 8    Period Weeks    Status New      PT LONG TERM GOAL #4   Title ind with HEP to maintain strength gains in therapy.    Time 8    Period Weeks    Status New      PT LONG TERM GOAL #5   Title improved left shoulder flexion in standing to 155 deg or  greater to ease OH activities.    Time 8    Period Weeks    Status New                 Plan - 12/22/20 1229    Clinical Impression Statement Pt joined a gym and is going 6 days a week.  PT went over cable pulley shoulder/back exercises and Pt demo'd good form.  Pt continues to have weakness in Lt shoulder ER so focus is in A/ROM in SL or using tband or doorway for isometric strength.  PT palpated infraspinatus and Pt with good activation.  PT added hip and LE ther ex to program today with Pt demo of good form and report of muscle fatigue with ther ex.  Continue along POC.    Comorbidities giant cell arteritis, OP, Rt RCR    PT Frequency 2x / week    PT Duration 8 weeks    PT Treatment/Interventions ADLs/Self Care Home Management;Cryotherapy;Moist Heat;Therapeutic activities;Therapeutic exercise;Neuromuscular re-education;Manual techniques;Patient/family education;Dry needling    PT Next Visit Plan f/u on updates for LE ther ex added last visit, continue focusing on Lt shoulder ER strength, weight bearing resisted exercise for OP    PT Home Exercise Plan [id number]    Consulted and Agree with Plan of Care Patient           Patient will benefit from skilled therapeutic intervention in order to improve the following deficits and impairments:     Visit Diagnosis: Muscle weakness (generalized)  Stiffness of left shoulder, not elsewhere classified     Problem List There are no problems to display for this patient.   Venetia Night Jeanette Rauth, PT 12/22/20 12:33 PM   [facility] [address], [place], [place], [address] Phone: [phone]   Fax:  [phone]  Name: Cory Richard. MRN: [id number] Date of Birth: 01-23-1941

## 2020-12-25 ENCOUNTER — Ambulatory Visit: Payer: Medicare Other | Admitting: Physical Therapy

## 2020-12-25 ENCOUNTER — Encounter: Payer: Self-pay | Admitting: Physical Therapy

## 2020-12-25 ENCOUNTER — Other Ambulatory Visit: Payer: Self-pay

## 2020-12-25 DIAGNOSIS — M6281 Muscle weakness (generalized): Secondary | ICD-10-CM | POA: Diagnosis not present

## 2020-12-25 DIAGNOSIS — M25612 Stiffness of left shoulder, not elsewhere classified: Secondary | ICD-10-CM

## 2020-12-25 NOTE — Therapy (Signed)
[facility] [address], [place], [place], [address] Phone: [phone]   Fax:  [phone]  Physical Therapy Treatment  Patient Details  Name: Cory Richard. MRN: [id number] Date of Birth: 15-Aug-1941 Referring Provider (PT): Seward Carol MD   Encounter Date: 12/25/2020   PT End of Session - 12/25/20 0855    Visit Number 5    Date for PT Re-Evaluation 01/26/21    Authorization Type MCR    Progress Note Due on Visit 10    PT Start Time 0847    PT Stop Time 0926    PT Time Calculation (min) 39 min    Activity Tolerance Patient tolerated treatment well    Behavior During Therapy [facility] for tasks assessed/performed           History reviewed. No pertinent past medical history.  History reviewed. No pertinent surgical history.  There were no vitals filed for this visit.   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 Yes    Pain Score 3     Pain Location Shoulder    Pain Orientation Left    Pain Descriptors / Indicators Dull;Aching    Aggravating Factors  No idea: "always a little ache or something."    Pain Relieving Factors Exercise    Multiple Pain Sites No                             [facility] Adult PT Treatment/Exercise - 12/25/20 0001      Shoulder Exercises: Seated   External Rotation --   Green loop 3 sec hold 2x10     Shoulder Exercises: Sidelying   External Rotation AROM;Strengthening;Right;20 reps    External Rotation Limitations tried 3rd set with 1#: could do just smaller ROM      Shoulder Exercises: ROM/Strengthening   UBE (Upper Arm Bike) L 1.7 3x3 with PTa discussion of current status and how gym workout are going    Wall Pushups --   Counter top  pushups 2x10     Shoulder Exercises: Power Development worker, community --   15# 2x10   Row --   25# 2x15   Internal Rotation --   Bil UE: 2x10 15#                   PT Short Term Goals - 12/22/20 1154      PT SHORT TERM GOAL #1   Title Ind with initial HEP    Baseline met for shoulders, added hips today 12/22/20    Status Achieved      PT SHORT TERM GOAL #2   Title Pt to understand and verbalize importance of weightbearing activities in the treatment of osteoporosis.    Status Achieved             PT Long Term Goals - 12/01/20 1253      PT LONG TERM GOAL #1   Title Improved bil shoulder strength to 4+/5 or better to normalize ADLS    Time 8    Period Weeks    Status New    Target Date 01/26/21      PT LONG TERM GOAL #2  Title Patient to report overall improvement of strength with ADLS by 75% or more.    Time 8    Period Weeks    Status New      PT LONG TERM GOAL #3   Title improved bil hip strength to 5/5 to improve function.    Time 8    Period Weeks    Status New      PT LONG TERM GOAL #4   Title ind with HEP to maintain strength gains in therapy.    Time 8    Period Weeks    Status New      PT LONG TERM GOAL #5   Title improved left shoulder flexion in standing to 155 deg or greater to ease OH activities.    Time 8    Period Weeks    Status New                 Plan - 12/25/20 0855    Clinical Impression Statement Pt worked primarily with gym equipment and light weights for Bil shoulder strength: pt demonstrated excellent control with all exercises and can continue performing them at the gym. Pt reports he can lift heavier items that are either overhead or slightly above shoulder level and bring the item down with improved control. Pt demonstrated significant improvement with RT shoulder ER today in sidelying, he could rotate in a full ROM and no wrist extesnion substitutions.1# was difficult but he could rotate the Rt arm about 75% of full ROM.     Personal Factors and Comorbidities Comorbidity 2;Comorbidity 3+    Comorbidities giant cell arteritis, OP, Rt RCR    Examination-Activity Limitations Lift;Reach Overhead    Stability/Clinical Decision Making Stable/Uncomplicated    Rehab Potential Excellent    PT Frequency 2x / week    PT Duration 8 weeks    PT Treatment/Interventions ADLs/Self Care Home Management;Cryotherapy;Moist Heat;Therapeutic activities;Therapeutic exercise;Neuromuscular re-education;Manual techniques;Patient/family education;Dry needling    PT Next Visit Plan Shoulder strength, general and external rotation specific    PT Home Exercise Plan [email or web address]    Consulted and Agree with Plan of Care Patient           Patient will benefit from skilled therapeutic intervention in order to improve the following deficits and impairments:  Decreased range of motion,Postural dysfunction,Decreased strength,Impaired flexibility,Impaired UE functional use  Visit Diagnosis: Muscle weakness (generalized)  Stiffness of left shoulder, not elsewhere classified     Problem List There are no problems to display for this patient.   COCHRAN,JENNIFER, PTA 12/25/2020, 9:28 AM  [facility] [address] [place], [place], [address] Phone: [phone]   Fax:  [phone]  Name: Cory S Satterwhite Jr. MRN: [id number] Date of Birth: 09/16/1941   

## 2020-12-28 ENCOUNTER — Other Ambulatory Visit: Payer: Self-pay

## 2020-12-28 ENCOUNTER — Ambulatory Visit: Payer: Medicare Other

## 2020-12-28 DIAGNOSIS — M6281 Muscle weakness (generalized): Secondary | ICD-10-CM | POA: Diagnosis not present

## 2020-12-28 DIAGNOSIS — M25612 Stiffness of left shoulder, not elsewhere classified: Secondary | ICD-10-CM

## 2020-12-28 NOTE — Therapy (Signed)
[facility] [address], [address] [place], [place], [address] Phone: [phone]   Fax:  [phone]  Physical Therapy Treatment  Patient Details  Name: Cory Richard. MRN: [id number] Date of Birth: Aug 11, 1941 Referring Provider (PT): Renford Dills MD   Encounter Date: 12/28/2020   PT End of Session - 12/28/20 1009    Visit Number 6    Date for PT Re-Evaluation 01/26/21    Authorization Type MCR    Progress Note Due on Visit 10    PT Start Time 0930    PT Stop Time 1009    PT Time Calculation (min) 39 min    Activity Tolerance Patient tolerated treatment well    Behavior During Therapy [facility] for tasks assessed/performed           History reviewed. No pertinent past medical history.  History reviewed. No pertinent surgical history.  There were no vitals filed for this visit.   Subjective Assessment - 12/28/20 0931    Subjective I am doing ok.    Pain Score 0-No pain              [facility] PT Assessment - 12/28/20 0001      AROM   Right Shoulder Flexion 155 Degrees    Left Shoulder Flexion 156 Degrees                         [facility] Adult PT Treatment/Exercise - 12/28/20 0001      Knee/Hip Exercises: Standing   Hip Abduction Stengthening;Both;1 set;10 reps;Knee straight    Abduction Limitations at counter    Hip Extension Stengthening;Both;1 set;10 reps;Knee straight    Extension Limitations at counter    Walking with Sports Cord 20# forward and reverse x 10 each      Knee/Hip Exercises: Seated   Sit to [employer] Other (comment);10 reps;without UE support;2 sets   squat to mat table touch x 10, VCs for hip hinge     Shoulder Exercises: Seated   External Rotation --   Green loop 3 sec hold 2x10     Shoulder Exercises: Standing   Flexion Strengthening;Right;Left;10 reps;Weights    Shoulder Flexion Weight (lbs) 1#    Flexion Limitations using finger ladder      Shoulder Exercises: Pulleys    Flexion 2 minutes      Shoulder Exercises: ROM/Strengthening   UBE (Upper Arm Bike) L 1.7 3x3 with PT discussion of current status and how gym workout are going    Wall Pushups --   Counter top pushups 2x10                   PT Short Term Goals - 12/28/20 0932      PT SHORT TERM GOAL #1   Title Ind with initial HEP    Status Achieved      PT SHORT TERM GOAL #2   Title Pt to understand and verbalize importance of weightbearing activities in the treatment of osteoporosis.    Status Achieved             PT Long Term Goals - 12/01/20 1253      PT LONG TERM GOAL #1   Title Improved bil shoulder strength to 4+/5 or better to normalize ADLS    Time 8    Period Weeks    Status New    Target Date 01/26/21      PT LONG TERM GOAL #2  Title Patient to report overall improvement of strength with ADLS by 75% or more.    Time 8    Period Weeks    Status New      PT LONG TERM GOAL #3   Title improved bil hip strength to 5/5 to improve function.    Time 8    Period Weeks    Status New      PT LONG TERM GOAL #4   Title ind with HEP to maintain strength gains in therapy.    Time 8    Period Weeks    Status New      PT LONG TERM GOAL #5   Title improved left shoulder flexion in standing to 155 deg or greater to ease OH activities.    Time 8    Period Weeks    Status New                 Plan - 12/28/20 1011    Clinical Impression Statement Pt just left the gym prior to appointment today so session focused on exercises that he didn't perform at the gym. Pt with increased ease of overhead use with the Lt UE and was able to perform finger ladder with 1# weight.  Pt required tactile cues for scapular depression with overhead movement bilaterally.  Pt is performing weightbearing activity daily.  PT reviewed all HEP with pt and emphasized the importance of compliance.  Pt will continue to benefit from skilled PT to address shoulder strength, endurance and mobility.     PT Frequency 2x / week    PT Duration 8 weeks    PT Treatment/Interventions ADLs/Self Care Home Management;Cryotherapy;Moist Heat;Therapeutic activities;Therapeutic exercise;Neuromuscular re-education;Manual techniques;Patient/family education;Dry needling    PT Next Visit Plan Shoulder strength, general and external rotation specific    PT Home Exercise Plan [email or web address]    Consulted and Agree with Plan of Care Patient           Patient will benefit from skilled therapeutic intervention in order to improve the following deficits and impairments:  Decreased range of motion,Postural dysfunction,Decreased strength,Impaired flexibility,Impaired UE functional use  Visit Diagnosis: Muscle weakness (generalized)  Stiffness of left shoulder, not elsewhere classified     Problem List There are no problems to display for this patient.    [name], PT 12/28/20 10:12 AM  [facility] [address], [address] [place], [place], [address] Phone: [phone]   Fax:  [phone]  Name: Cory Richard. MRN: [id number] Date of Birth: July 22, 1941

## 2020-12-30 ENCOUNTER — Other Ambulatory Visit: Payer: Self-pay

## 2020-12-30 ENCOUNTER — Ambulatory Visit: Payer: Medicare Other

## 2020-12-30 DIAGNOSIS — M25612 Stiffness of left shoulder, not elsewhere classified: Secondary | ICD-10-CM

## 2020-12-30 DIAGNOSIS — M6281 Muscle weakness (generalized): Secondary | ICD-10-CM | POA: Diagnosis not present

## 2020-12-30 NOTE — Therapy (Signed)
[facility] [address], [address] [place], [place], [address] Phone: [phone]   Fax:  [phone]  Physical Therapy Treatment  Patient Details  Name: Cory Richard. MRN: [id number] Date of Birth: 04/28/1941 Referring Provider (PT): Renford Dills MD   Encounter Date: 12/30/2020   PT End of Session - 12/30/20 1007    Visit Number 7    Date for PT Re-Evaluation 01/26/21    Authorization Type MCR    Progress Note Due on Visit 10    PT Start Time 0932    PT Stop Time 1007    PT Time Calculation (min) 35 min    Activity Tolerance Patient tolerated treatment well    Behavior During Therapy [facility] for tasks assessed/performed           History reviewed. No pertinent past medical history.  History reviewed. No pertinent surgical history.  There were no vitals filed for this visit.   Subjective Assessment - 12/30/20 0932    Subjective My shoulders are aching today.    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                             [facility] Adult PT Treatment/Exercise - 12/30/20 0001      Knee/Hip Exercises: Standing   Walking with Sports Cord 20# forward and reverse x 10 each- CGA by PT for safety      Knee/Hip Exercises: Seated   Sit to Sand Other (comment);10 reps;without UE support;2 sets   holding 10# kettlebell     Shoulder Exercises: Standing   External Rotation Strengthening;Both;Theraband    External Rotation Limitations standing on balance pad- ER with green band    Flexion Strengthening;Right;Left;10 reps;Weights    Shoulder Flexion Weight (lbs) 1#    Flexion Limitations using finger ladder    Extension Strengthening;Both;20 reps;Theraband    Theraband Level (Shoulder Extension) Level 3 (Green)    Extension Limitations standing on black pad    Row  Strengthening;Both;20 reps;Theraband    Theraband Level (Shoulder Row) Level 3 (Green)    Row Limitations standing on balance pad      Shoulder Exercises: ROM/Strengthening   UBE (Upper Arm Bike) L 2- 3x3 with PT discussion of current status and how gym workout are going    Wall Pushups --   using red ball                   PT Short Term Goals - 12/30/20 0934      PT SHORT TERM GOAL #2   Title Pt to understand and verbalize importance of weightbearing activities in the treatment of osteoporosis.    Status Achieved             PT Long Term Goals - 12/01/20 1253      PT LONG TERM GOAL #1   Title Improved bil shoulder strength to 4+/5 or better to normalize ADLS    Time 8    Period Weeks    Status New    Target Date 01/26/21      PT LONG TERM GOAL #2   Title Patient to report overall improvement of strength with ADLS by 75% or more.    Time 8    Period Weeks  Status New      PT LONG TERM GOAL #3   Title improved bil hip strength to 5/5 to improve function.    Time 8    Period Weeks    Status New      PT LONG TERM GOAL #4   Title ind with HEP to maintain strength gains in therapy.    Time 8    Period Weeks    Status New      PT LONG TERM GOAL #5   Title improved left shoulder flexion in standing to 155 deg or greater to ease OH activities.    Time 8    Period Weeks    Status New                 Plan - 12/30/20 0942    Clinical Impression Statement Pt continues to exercise regularly at the gym.  Pt with increased ease of overhead use with the Lt UE for self-care and home tasks. Pt did well with advancement of exercise on foam pad to challenge balance and core activation.   Pt is performing weightbearing activity daily.  Pt will continue to benefit from skilled PT to address shoulder strength, endurance and mobility.    PT Frequency 2x / week    PT Duration 8 weeks    PT Treatment/Interventions ADLs/Self Care Home Management;Cryotherapy;Moist  Heat;Therapeutic activities;Therapeutic exercise;Neuromuscular re-education;Manual techniques;Patient/family education;Dry needling    PT Next Visit Plan Shoulder strength, general and external rotation specific, weightbearing and balance/endurance activities    PT Home Exercise Plan [email or web address]    Consulted and Agree with Plan of Care Patient           Patient will benefit from skilled therapeutic intervention in order to improve the following deficits and impairments:  Decreased range of motion,Postural dysfunction,Decreased strength,Impaired flexibility,Impaired UE functional use  Visit Diagnosis: Muscle weakness (generalized)  Stiffness of left shoulder, not elsewhere classified     Problem List There are no problems to display for this patient.   [name], PT 12/30/20 10:08 AM  [facility] [address], [address] [place], [place], [address] Phone: [phone]   Fax:  [phone]  Name: Cory Richard. MRN: [id number] Date of Birth: [birth year]

## 2021-01-04 ENCOUNTER — Encounter: Payer: Self-pay | Admitting: Physical Therapy

## 2021-01-06 ENCOUNTER — Other Ambulatory Visit: Payer: Self-pay

## 2021-01-06 ENCOUNTER — Ambulatory Visit: Payer: Medicare Other

## 2021-01-06 DIAGNOSIS — M25612 Stiffness of left shoulder, not elsewhere classified: Secondary | ICD-10-CM

## 2021-01-06 DIAGNOSIS — M6281 Muscle weakness (generalized): Secondary | ICD-10-CM | POA: Diagnosis not present

## 2021-01-06 NOTE — Therapy (Signed)
[facility] [address] [place], [place], [address] Phone: [phone]   Fax:  [phone]  Physical Therapy Treatment  Patient Details  Name: Cory Richard. MRN: [id number] Date of Birth: Apr 20, 1941 Referring Provider (PT): Renford Dills MD   Encounter Date: 01/06/2021   PT End of Session - 01/06/21 1004    Visit Number 8    Date for PT Re-Evaluation 01/26/21    Authorization Type MCR    Progress Note Due on Visit 10    PT Start Time 0925    PT Stop Time 1004    PT Time Calculation (min) 39 min    Activity Tolerance Patient tolerated treatment well    Behavior During Therapy [facility] for tasks assessed/performed           History reviewed. No pertinent past medical history.  History reviewed. No pertinent surgical history.  There were no vitals filed for this visit.   Subjective Assessment - 01/06/21 0924    Subjective I am sore in my shoulders, moving them helps.  I have better endurance now.  I was able to shovel snow without much difficulty.    Currently in Pain? No/denies                             [facility] Adult PT Treatment/Exercise - 01/06/21 0001      Lumbar Exercises: Standing   Other Standing Lumbar Exercises tandem stance with 5# kettle bell biceps curls 2x10 bil each      Knee/Hip Exercises: Standing   Walking with Sports Cord 20# forward and reverse x 10 each- SBA by PT for safety      Knee/Hip Exercises: Seated   Sit to Sand Other (comment);10 reps;without UE support;2 sets   holding 10# kettlebell     Shoulder Exercises: Seated   Other Seated Exercises 3 way raises: 1# 2x10- no weight with abduction due to challenge      Shoulder Exercises: Standing   External Rotation Strengthening;Both;Theraband    External Rotation Limitations standing on balance pad- ER with green band    Flexion Strengthening;Right;Left;10 reps;Weights    Shoulder Flexion Weight (lbs) 1#     Flexion Limitations using finger ladder      Shoulder Exercises: Pulleys   ABduction 3 minutes      Shoulder Exercises: ROM/Strengthening   UBE (Upper Arm Bike) L 2- 3x3 with PT discussion of current status and how gym workout are going    Wall Pushups --   using red ball                   PT Short Term Goals - 12/30/20 0934      PT SHORT TERM GOAL #2   Title Pt to understand and verbalize importance of weightbearing activities in the treatment of osteoporosis.    Status Achieved             PT Long Term Goals - 12/01/20 1253      PT LONG TERM GOAL #1   Title Improved bil shoulder strength to 4+/5 or better to normalize ADLS    Time 8    Period Weeks    Status New    Target Date 01/26/21      PT LONG TERM GOAL #2   Title Patient to report overall improvement of strength with ADLS by 75% or more.    Time 8  Period Weeks    Status New      PT LONG TERM GOAL #3   Title improved bil hip strength to 5/5 to improve function.    Time 8    Period Weeks    Status New      PT LONG TERM GOAL #4   Title ind with HEP to maintain strength gains in therapy.    Time 8    Period Weeks    Status New      PT LONG TERM GOAL #5   Title improved left shoulder flexion in standing to 155 deg or greater to ease OH activities.    Time 8    Period Weeks    Status New                 Plan - 01/06/21 0956    Clinical Impression Statement Pt continues to exercise regularly at the gym.  Pt reports improved UE endurance with home tasks and was able to shovel snow over the past few days. Pt was challenged with shoulder 3 way raises especially into scaption and abduction.  Pt with shoulder fatigue at the end of session. PT provided verbal and tactile cues to reduce scapular elevation. Pt is performing weightbearing activity daily.  Pt will continue to benefit from skilled PT to address shoulder strength, endurance and mobility.    PT Frequency 2x / week    PT Duration 8  weeks    PT Treatment/Interventions ADLs/Self Care Home Management;Cryotherapy;Moist Heat;Therapeutic activities;Therapeutic exercise;Neuromuscular re-education;Manual techniques;Patient/family education;Dry needling    PT Next Visit Plan Shoulder strength, general and external rotation specific, weightbearing and balance/endurance activities    PT Home Exercise Plan [id number]    Consulted and Agree with Plan of Care Patient           Patient will benefit from skilled therapeutic intervention in order to improve the following deficits and impairments:  Decreased range of motion,Postural dysfunction,Decreased strength,Impaired flexibility,Impaired UE functional use  Visit Diagnosis: Muscle weakness (generalized)  Stiffness of left shoulder, not elsewhere classified     Problem List There are no problems to display for this patient.   [name], PT 01/06/21 10:06 AM  [facility] [address] [place], [place], [address] Phone: [phone]   Fax:  [phone]  Name: Skyeler Smola. MRN: [id number] Date of Birth: September 14, 1941

## 2021-01-11 ENCOUNTER — Other Ambulatory Visit: Payer: Self-pay

## 2021-01-11 ENCOUNTER — Ambulatory Visit: Payer: Medicare Other

## 2021-01-11 DIAGNOSIS — M25612 Stiffness of left shoulder, not elsewhere classified: Secondary | ICD-10-CM

## 2021-01-11 DIAGNOSIS — M6281 Muscle weakness (generalized): Secondary | ICD-10-CM | POA: Diagnosis not present

## 2021-01-11 NOTE — Therapy (Signed)
[facility] [address], [address] [place], [place], [address] Phone: [phone]   Fax:  [phone]  Physical Therapy Treatment  Patient Details  Name: Cory Richard. MRN: [id number] Date of Birth: 1941/04/08 Referring Provider (PT): Renford Dills MD   Encounter Date: 01/11/2021   PT End of Session - 01/11/21 1010    Visit Number 9    Date for PT Re-Evaluation 01/26/21    Authorization Type MCR    Progress Note Due on Visit 10    PT Start Time 0929    PT Stop Time 1010    PT Time Calculation (min) 41 min    Activity Tolerance Patient tolerated treatment well    Behavior During Therapy [facility] for tasks assessed/performed           History reviewed. No pertinent past medical history.  History reviewed. No pertinent surgical history.  There were no vitals filed for this visit.   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    Currently in Pain? No/denies                             [facility] Adult PT Treatment/Exercise - 01/11/21 0001      Lumbar Exercises: Standing   Other Standing Lumbar Exercises tandem stance with 5# kettle bell biceps curls 2x10 bil each      Knee/Hip Exercises: Standing   Walking with Sports Cord 20# forward and reverse x 10 each,  15# sidestepping x5 each- SBA by PT for safety      Knee/Hip Exercises: Seated   Sit to Sand Other (comment);10 reps;without UE support;2 sets   holding 10# kettlebell     Shoulder Exercises: Seated   Other Seated Exercises 3 way raises: 1# 2x10- no weight with abduction due to challenge      Shoulder Exercises: Standing   External Rotation Limitations standing on balance pad- ER with green band    Flexion Strengthening;Right;Left;10 reps;Weights    Shoulder Flexion Weight (lbs)  1#    Flexion Limitations using finger ladder      Shoulder Exercises: ROM/Strengthening   UBE (Upper Arm Bike) L 2- 3x3 with PT discussion of current status and how gym workout are going    Wall Pushups --   using red ball                   PT Short Term Goals - 01/11/21 6546      PT SHORT TERM GOAL #1   Title Ind with initial HEP    Status Achieved      PT SHORT TERM GOAL #2   Title Pt to understand and verbalize importance of weightbearing activities in the treatment of osteoporosis.    Status Achieved             PT Long Term Goals - 12/01/20 1253      PT LONG TERM GOAL #1   Title Improved bil shoulder strength to 4+/5 or better to normalize ADLS    Time 8    Period Weeks    Status New    Target Date 01/26/21      PT LONG TERM GOAL #2   Title Patient to report overall improvement of strength  with ADLS by 75% or more.    Time 8    Period Weeks    Status New      PT LONG TERM GOAL #3   Title improved bil hip strength to 5/5 to improve function.    Time 8    Period Weeks    Status New      PT LONG TERM GOAL #4   Title ind with HEP to maintain strength gains in therapy.    Time 8    Period Weeks    Status New      PT LONG TERM GOAL #5   Title improved left shoulder flexion in standing to 155 deg or greater to ease OH activities.    Time 8    Period Weeks    Status New                 Plan - 01/11/21 0945    Clinical Impression Statement Pt continues to exercise regularly at the gym.  Pt reports improved UE endurance with home tasks and was able to shovel snow over the past week.  Pt was less challenged with shoulder 3 way raises especially into scaption and abduction. Pt has difficulty in mid range with shoulders and PT discussed ways to modify his home environment to reduce the need to work in this range.  Pt was less fatigued at the end of session. PT provided verbal and tactile cues to reduce scapular elevation. Pt is performing  weightbearing activity daily.  Pt will continue to benefit from skilled PT to address shoulder strength, endurance and mobility.    PT Frequency 2x / week    PT Duration 8 weeks    PT Treatment/Interventions ADLs/Self Care Home Management;Cryotherapy;Moist Heat;Therapeutic activities;Therapeutic exercise;Neuromuscular re-education;Manual techniques;Patient/family education;Dry needling    PT Next Visit Plan Shoulder strength, general and external rotation specific, weightbearing and balance/endurance activities    PT Home Exercise Plan [email or web address]           Patient will benefit from skilled therapeutic intervention in order to improve the following deficits and impairments:  Decreased range of motion,Postural dysfunction,Decreased strength,Impaired flexibility,Impaired UE functional use  Visit Diagnosis: Muscle weakness (generalized)  Stiffness of left shoulder, not elsewhere classified     Problem List There are no problems to display for this patient.   [name], PT 01/11/21 10:12 AM  [facility] [address] [place], [place], [address] Phone: [phone]   Fax:  [phone]  Name: Krishna Dancel. MRN: [id number] Date of Birth: [birth year]

## 2021-01-13 ENCOUNTER — Ambulatory Visit: Payer: Medicare Other | Admitting: Physical Therapy

## 2021-01-13 ENCOUNTER — Encounter: Payer: Self-pay | Admitting: Physical Therapy

## 2021-01-13 ENCOUNTER — Other Ambulatory Visit: Payer: Self-pay

## 2021-01-13 DIAGNOSIS — M25612 Stiffness of left shoulder, not elsewhere classified: Secondary | ICD-10-CM

## 2021-01-13 DIAGNOSIS — M6281 Muscle weakness (generalized): Secondary | ICD-10-CM

## 2021-01-13 NOTE — Therapy (Addendum)
[facility] [address] [place], [place], [address] Phone: [phone]   Fax:  [phone]  Physical Therapy Treatment  Patient Details  Name: Cory Richard. MRN: [id number] Date of Birth: 05-Jul-1941 Referring Provider (PT): Renford Dills MD   Encounter Date: 01/13/2021 Progress Note Reporting Period 12/01/20 to 01/13/21  See note below for Objective Data and Assessment of Progress/Goals.  Pt will benefit from continued PT to address functional strength and progression of program to address shoulder strength and pain.    [name], PT 01/13/21 11:59 AM        PT End of Session - 01/13/21 0930    Visit Number 10    Date for PT Re-Evaluation 01/26/21    Authorization Type MCR    Progress Note Due on Visit 10    PT Start Time 0930    PT Stop Time 1013    PT Time Calculation (min) 43 min    Activity Tolerance Patient tolerated treatment well    Behavior During Therapy [facility] for tasks assessed/performed           History reviewed. No pertinent past medical history.  History reviewed. No pertinent surgical history.  There were no vitals filed for this visit.   Subjective Assessment - 01/13/21 0934    Subjective Shoulders pretty achey yesterday and today.    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 --   Just achey, 3/10-ish   Multiple Pain Sites No              [facility] PT Assessment - 01/13/21 0001      Assessment   Medical Diagnosis physical deconditioning    Referring Provider (PT) Renford Dills MD    Onset Date/Surgical Date 05/19/20      AROM   Right Shoulder Flexion 155 Degrees    Left Shoulder Flexion 156 Degrees      Strength   Overall Strength Comments Bil hip abd 5/5    Right Shoulder ABduction 4-/5    Right Shoulder External Rotation 4-/5    Left Shoulder ABduction 4/5    Left  Shoulder External Rotation 4-/5                         [facility] Adult PT Treatment/Exercise - 01/13/21 0001      Knee/Hip Exercises: Standing   Other Standing Knee Exercises green band resisted walking 3 ways: added to HEP, gave ot band for home      Shoulder Exercises: Seated   Other Seated Exercises 3 way raise: 2# front bil 10x, scaption 8x RT 10x Lt 2#, abduction 1# bil 10x      Shoulder Exercises: Standing   Other Standing Exercises Green loop isometric ER bil with wall slide 10x2    Other Standing Exercises Standing on black pad: yellow TB horizontal abd 2x10, TC to relax Bil UT      Shoulder Exercises: ROM/Strengthening   UBE (Upper Arm Bike) L 2.5 3x3 with PTA present to discuss status    Wall Pushups Limitations counter top pushups 2x10                  PT Education - 01/13/21 1003    Education Details HEP    Person(s) Educated Patient    Methods Demonstration;Explanation;Verbal cues;Handout    Comprehension Returned demonstration;Verbalized understanding  PT Short Term Goals - 01/11/21 8185      PT SHORT TERM GOAL #1   Title Ind with initial HEP    Status Achieved      PT SHORT TERM GOAL #2   Title Pt to understand and verbalize importance of weightbearing activities in the treatment of osteoporosis.    Status Achieved             PT Long Term Goals - 01/13/21 0956      PT LONG TERM GOAL #1   Title Improved bil shoulder strength to 4+/5 or better to normalize ADLS    Time 8    Period Weeks    Status On-going      PT LONG TERM GOAL #2   Title Patient to report overall improvement of strength with ADLS by 75% or more.    Time 8    Period Weeks    Status On-going   50%-60%     PT LONG TERM GOAL #4   Title ind with HEP to maintain strength gains in therapy.    Time 8    Period Weeks    Status On-going                 Plan - 01/13/21 0935    Clinical Impression Statement Pt describes some increased deep  ache in his shoulder over the last two days. Pt had slightly improved strength in LT shoulder and bil hip abductors. PTA added resisted walking with green tband to HEP as pt wanted to simulate the resisted walking exercise here in the clinic. No increased shoulder pain with any exercises today.    Personal Factors and Comorbidities Comorbidity 2;Comorbidity 3+    Comorbidities giant cell arteritis, OP, Rt RCR    Examination-Activity Limitations Lift;Reach Overhead    Stability/Clinical Decision Making Stable/Uncomplicated    Rehab Potential Excellent    PT Frequency 2x / week    PT Duration 8 weeks    PT Treatment/Interventions ADLs/Self Care Home Management;Cryotherapy;Moist Heat;Therapeutic activities;Therapeutic exercise;Neuromuscular re-education;Manual techniques;Patient/family education;Dry needling    PT Next Visit Plan Shoulder strength, general and external rotation specific, weightbearing and balance/endurance activities    PT Home Exercise Plan [email or web address]    Consulted and Agree with Plan of Care Patient           Patient will benefit from skilled therapeutic intervention in order to improve the following deficits and impairments:  Decreased range of motion,Postural dysfunction,Decreased strength,Impaired flexibility,Impaired UE functional use  Visit Diagnosis: Stiffness of left shoulder, not elsewhere classified  Muscle weakness (generalized)     Problem List There are no problems to display for this patient.  Ane Payment, PTA 01/13/21 10:44 AM  01/13/2021, 10:44 AM   [facility] [address] [place], [place], [address] Phone: [phone]   Fax:  [phone]  Name: Cory Richard. MRN: [id number] Date of Birth: 10/28/1941

## 2021-01-18 ENCOUNTER — Encounter: Payer: Self-pay | Admitting: Physical Therapy

## 2021-01-18 ENCOUNTER — Other Ambulatory Visit: Payer: Self-pay

## 2021-01-18 ENCOUNTER — Ambulatory Visit: Payer: Medicare Other | Admitting: Physical Therapy

## 2021-01-18 DIAGNOSIS — M6281 Muscle weakness (generalized): Secondary | ICD-10-CM | POA: Diagnosis not present

## 2021-01-18 DIAGNOSIS — M25612 Stiffness of left shoulder, not elsewhere classified: Secondary | ICD-10-CM

## 2021-01-18 NOTE — Therapy (Signed)
[facility] [address] [place], [place], [address] Phone: [phone]   Fax:  [phone]  Physical Therapy Treatment  Patient Details  Name: Cory Richard. MRN: [id number] Date of Birth: September 15, 1941 Referring Provider (PT): Renford Dills MD   Encounter Date: 01/18/2021   PT End of Session - 01/18/21 0928    Visit Number 11    Date for PT Re-Evaluation 01/26/21    Authorization Type MCR    Progress Note Due on Visit 10    PT Start Time 0928    PT Stop Time 1012    PT Time Calculation (min) 44 min    Activity Tolerance Patient tolerated treatment well    Behavior During Therapy [facility] for tasks assessed/performed           History reviewed. No pertinent past medical history.  History reviewed. No pertinent surgical history.  There were no vitals filed for this visit.                      [facility] Adult PT Treatment/Exercise - 01/18/21 0001      Shoulder Exercises: Sidelying   External Rotation AROM;Strengthening;Both;20 reps    External Rotation Limitations TC at Infrasp.      Shoulder Exercises: Standing   Other Standing Exercises Green loop isometric ER bil with wall slide 10x2      Shoulder Exercises: ROM/Strengthening   UBE (Upper Arm Bike) L 1 ( no resisatnce) 3x3 with concurrent discussion of status    Wall Pushups Limitations counter top pushups 2x10    Other ROM/Strengthening Exercises [place] 4 Bil 10-15x   TC on Ext Rot   Other ROM/Strengthening Exercises quadruped alt arm lift 5x2 3 sec hold                    PT Short Term Goals - 01/11/21 8127      PT SHORT TERM GOAL #1   Title Ind with initial HEP    Status Achieved      PT SHORT TERM GOAL #2   Title Pt to understand and verbalize importance of weightbearing activities in the treatment of osteoporosis.    Status Achieved             PT Long Term Goals - 01/13/21 0956      PT LONG TERM GOAL  #1   Title Improved bil shoulder strength to 4+/5 or better to normalize ADLS    Time 8    Period Weeks    Status On-going      PT LONG TERM GOAL #2   Title Patient to report overall improvement of strength with ADLS by 75% or more.    Time 8    Period Weeks    Status On-going   50%-60%     PT LONG TERM GOAL #4   Title ind with HEP to maintain strength gains in therapy.    Time 8    Period Weeks    Status On-going                 Plan - 01/18/21 0931    Clinical Impression Statement Pt took 2 days off from exercising due to excessive soreness. Tried to focus more on closed chain stabilization and staying below 90degrees of shoulder flexion to help pt reduce soreness. When pt performs shoulder external rotation he demonstrates excessive contraction of his upper traps and lateral deltoids, this could be one  factor in his excessive soreness complaint. Pt agreed to take a break from doing any external rotation exercise with the tbands but was successful in sidelying which he will continue to do.    Personal Factors and Comorbidities Comorbidity 2;Comorbidity 3+    Comorbidities giant cell arteritis, OP, Rt RCR    Examination-Activity Limitations Lift;Reach Overhead    Stability/Clinical Decision Making Stable/Uncomplicated    Rehab Potential Excellent    PT Frequency 2x / week    PT Duration 8 weeks    PT Treatment/Interventions ADLs/Self Care Home Management;Cryotherapy;Moist Heat;Therapeutic activities;Therapeutic exercise;Neuromuscular re-education;Manual techniques;Patient/family education;Dry needling    PT Next Visit Plan If pt did not have increased soreness consider adding the quadruped alt arm lift to HEP next session. See how patient feels taking a break from ext rotation with bands.    PT Home Exercise Plan [email or web address]    Consulted and Agree with Plan of Care Patient           Patient will benefit from skilled therapeutic intervention in order to improve the following  deficits and impairments:  Decreased range of motion,Postural dysfunction,Decreased strength,Impaired flexibility,Impaired UE functional use  Visit Diagnosis: Stiffness of left shoulder, not elsewhere classified  Muscle weakness (generalized)     Problem List There are no problems to display for this patient.   Cory Richard, PTA 01/18/2021, 10:13 AM  [facility] [address], [address] [place], [place], [address] Phone: [phone]   Fax:  [phone]  Name: Cory Richard. MRN: [id number] Date of Birth: [birth year]

## 2021-01-20 ENCOUNTER — Other Ambulatory Visit: Payer: Self-pay

## 2021-01-20 ENCOUNTER — Ambulatory Visit: Payer: Medicare Other | Attending: Internal Medicine | Admitting: Physical Therapy

## 2021-01-20 ENCOUNTER — Encounter: Payer: Self-pay | Admitting: Physical Therapy

## 2021-01-20 DIAGNOSIS — M6281 Muscle weakness (generalized): Secondary | ICD-10-CM

## 2021-01-20 DIAGNOSIS — M25612 Stiffness of left shoulder, not elsewhere classified: Secondary | ICD-10-CM | POA: Diagnosis not present

## 2021-01-20 NOTE — Therapy (Signed)
[facility] [address] [place], [place], [address] Phone: [phone]   Fax:  [phone]  Physical Therapy Treatment  Patient Details  Name: Cory Richard. MRN: [id number] Date of Birth: 08-02-1941 Referring Provider (PT): Renford Dills MD   Encounter Date: 01/20/2021   PT End of Session - 01/20/21 0937    Visit Number 12    Date for PT Re-Evaluation 01/26/21    Authorization Type MCR    Progress Note Due on Visit 10    PT Start Time 0935    PT Stop Time 1013    PT Time Calculation (min) 38 min    Activity Tolerance Patient tolerated treatment well    Behavior During Therapy --           History reviewed. No pertinent past medical history.  History reviewed. No pertinent surgical history.  There were no vitals filed for this visit.   Subjective Assessment - 01/20/21 0939    Subjective Eliminating the external rotation with the band was very helpful in reducing my shoulder soreness.    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 No/denies    Multiple Pain Sites No                             [facility] Adult PT Treatment/Exercise - 01/20/21 0001      Shoulder Exercises: Sidelying   External Rotation AROM;Strengthening;Both;Weights   2x10   External Rotation Weight (lbs) 1      Shoulder Exercises: Standing   Other Standing Exercises Green loop isometric ER bil with wall slide 10x2   added to HEP today, will use red band at home     Shoulder Exercises: ROM/Strengthening   UBE (Upper Arm Bike) L2.7 3x3 with concurrent discussion of status    Other ROM/Strengthening Exercises quadruped alt arm lift 10x 2 3 sec hold   added to HEP today                 PT Education - 01/20/21 1017    Education Details HEP    Person(s) Educated Patient    Methods Explanation;Verbal cues;Handout     Comprehension Returned demonstration;Verbalized understanding            PT Short Term Goals - 01/11/21 0937      PT SHORT TERM GOAL #1   Title Ind with initial HEP    Status Achieved      PT SHORT TERM GOAL #2   Title Pt to understand and verbalize importance of weightbearing activities in the treatment of osteoporosis.    Status Achieved             PT Long Term Goals - 01/20/21 1000      PT LONG TERM GOAL #3   Title improved bil hip strength to 5/5 to improve function.    Time 8    Period Weeks    Status Achieved      PT LONG TERM GOAL #5   Title improved left shoulder flexion in standing to 155 deg or greater to ease OH activities.    Time 8    Period Weeks    Status Achieved                 Plan - 01/20/21 9892    Clinical Impression  Statement Pt reports today eliminating the tband external rotation exercises at home did make his shoulders feel better. We will stick with sidelying ER at this point which pt is agreeable to. PTA added quadruped arm lift for closed chain HEP and wall slides (flexion) with an isometric ER with red band. Pt has 1 more visit and is leaning to DC to his HEP/gym exercises.    Personal Factors and Comorbidities Comorbidity 2;Comorbidity 3+    Comorbidities giant cell arteritis, OP, Rt RCR    Examination-Activity Limitations Lift;Reach Overhead    Stability/Clinical Decision Making Stable/Uncomplicated    Rehab Potential Excellent    PT Frequency 2x / week    PT Duration 8 weeks    PT Treatment/Interventions ADLs/Self Care Home Management;Cryotherapy;Moist Heat;Therapeutic activities;Therapeutic exercise;Neuromuscular re-education;Manual techniques;Patient/family education;Dry needling    PT Next Visit Plan probable DC next session to HEP/gym    PT Home Exercise Plan [email or web address]    Consulted and Agree with Plan of Care Patient           Patient will benefit from skilled therapeutic intervention in order to improve the following  deficits and impairments:  Decreased range of motion,Postural dysfunction,Decreased strength,Impaired flexibility,Impaired UE functional use  Visit Diagnosis: Stiffness of left shoulder, not elsewhere classified  Muscle weakness (generalized)     Problem List There are no problems to display for this patient.   Malayia Spizzirri, PTA 01/20/2021, 10:17 AM  [facility] [address], [address] [place], [place], [address] Phone: [phone]   Fax:  [phone]  Name: Cory Richard. MRN: [id number] Date of Birth: 08-18-1941 Access Code: [email or web address]: [email or web address]: 02/02/2022Prepared by: Victorino Dike CochranExercises  Seated Shoulder External Rotation - 1 x daily - 7 x weekly - 3 sets - 10 reps  Standing Bilateral Low Shoulder Row with Anchored Resistance - 1 x daily - 7 x weekly - 3 sets - 10 reps  Shoulder Extension with Resistance - 1 x daily - 7 x weekly - 3 sets - 10 reps  Supine Shoulder Horizontal Abduction with Resistance - 1 x daily - 7 x weekly - 3 sets - 10 reps  Wall Push Up - 2 x daily - 7 x weekly - 2 sets - 10 reps  Sidelying Shoulder External Rotation AROM - 2 x daily - 7 x weekly - 2 sets - 10 reps  Standing Hip Extension with Counter Support - 1 x daily - 7 x weekly - 2 sets - 10 reps  Standing Hip Abduction with Unilateral Counter Support - 1 x daily - 7 x weekly - 2 sets - 10 reps  Standing Hip Flexion with Counter Support - 1 x daily - 7 x weekly - 2 sets - 10 reps  Supine Active Straight Leg Raise - 1 x daily - 7 x weekly - 2 sets - 10 reps  Runner's Climb - 1 x daily - 7 x weekly - 2 sets - 10 reps  Squat with Chair Touch - 1 x daily - 7 x weekly - 3 sets - 10 reps  Side Stepping with Resistance at Thighs - 1 x daily - 7 x weekly - 3 sets - 10 reps  Marching with Resistance - 1 x daily - 7 x weekly - 3 sets - 10 reps  Backward Walking with Counter Support - 1 x daily - 7 x weekly - 3  sets - 10 reps  Quadruped Alternating Arm Lift - 1 x daily - 7 x weekly -  2 sets - 10 reps - 5 hold

## 2021-01-20 NOTE — Patient Instructions (Signed)
Tie red band around your wrists, making a loop. Pull the band apart until just taught ( pull from shoulder blade muscle) then slide the arms up the wall.  Do 2x10 working your way up to 2x15

## 2021-01-25 ENCOUNTER — Other Ambulatory Visit: Payer: Self-pay

## 2021-01-25 ENCOUNTER — Ambulatory Visit: Payer: Medicare Other

## 2021-01-25 DIAGNOSIS — M25612 Stiffness of left shoulder, not elsewhere classified: Secondary | ICD-10-CM | POA: Diagnosis not present

## 2021-01-25 DIAGNOSIS — M6281 Muscle weakness (generalized): Secondary | ICD-10-CM

## 2021-01-25 NOTE — Therapy (Signed)
[facility] [address], [place], [place], [address] Phone: [phone]   Fax:  [phone]  Physical Therapy Treatment  Patient Details  Name: Cory Richard. MRN: [id number] Date of Birth: April 13, 1941 Referring Provider (PT): Seward Carol MD   Encounter Date: 01/25/2021   PT End of Session - 01/25/21 1010    Visit Number 13    PT Start Time 0932    PT Stop Time 1007    PT Time Calculation (min) 35 min    Activity Tolerance Patient tolerated treatment well    Behavior During Therapy [facility] for tasks assessed/performed           History reviewed. No pertinent past medical history.  History reviewed. No pertinent surgical history.  There were no vitals filed for this visit.   Subjective Assessment - 01/25/21 0933    Subjective I am ready for D/C to HEP and gym exercises.    Currently in Pain? Yes    Pain Score 2     Pain Orientation Left    Pain Descriptors / Indicators Aching;Dull    Pain Type Chronic pain    Pain Onset More than a month ago    Pain Frequency Intermittent    Aggravating Factors  It just aches    Pain Relieving Factors exercise              [facility] PT Assessment - 01/25/21 0001      Assessment   Medical Diagnosis physical deconditioning    Referring Provider (PT) Seward Carol MD    Onset Date/Surgical Date 05/19/20      AROM   Right Shoulder Flexion 155 Degrees    Left Shoulder Flexion 156 Degrees      Strength   Overall Strength Comments Bil hip abd 5/5    Right Shoulder ABduction 4-/5    Right Shoulder External Rotation 4-/5    Left Shoulder ABduction 4/5    Left Shoulder External Rotation 4-/5                         [facility] Adult PT Treatment/Exercise - 01/25/21 0001      Knee/Hip Exercises: Standing   Hip Flexion Stengthening;Both;1 set;10 reps;Knee straight    Hip Flexion Limitations at counter, with yellow loop around feet    Hip Abduction  Stengthening;Both;1 set;10 reps;Knee straight    Abduction Limitations at counter, with yellow band    Other Standing Knee Exercises sidestepping with yellow loop around thighs x50 feet      Shoulder Exercises: Sidelying   External Rotation AROM;Strengthening;Both;Weights   2x10   External Rotation Weight (lbs) 1    External Rotation Limitations TC at Infraspinatus.      Shoulder Exercises: Standing   Other Standing Exercises Green loop isometric ER bil with wall slide 10x2   added to HEP today, will use red band at home     Shoulder Exercises: ROM/Strengthening   UBE (Upper Arm Bike) L2.7 3x3 with concurrent discussion of status    Wall Pushups Limitations counter top pushups 2x10    Other ROM/Strengthening Exercises quadruped alt arm lift 10x 2 3 sec hold   added to HEP today                 PT Education - 01/25/21 1008    Education Details verbal review of all HEP today with discussion regarding replication at the gym and how to advance this program  after discharge. [id number]    Person(s) Educated Patient    Methods Explanation;Demonstration;Handout    Comprehension Verbalized understanding;Returned demonstration            PT Short Term Goals - 01/11/21 0937      PT SHORT TERM GOAL #1   Title Ind with initial HEP    Status Achieved      PT SHORT TERM GOAL #2   Title Pt to understand and verbalize importance of weightbearing activities in the treatment of osteoporosis.    Status Achieved             PT Long Term Goals - 01/25/21 0941      PT LONG TERM GOAL #1   Title Improved bil shoulder strength to 4+/5 or better to normalize ADLS    Status Partially Met      PT LONG TERM GOAL #2   Title Patient to report overall improvement of strength with ADLS by 92% or more.      PT LONG TERM GOAL #3   Title improved bil hip strength to 5/5 to improve function.    Status Partially Met      PT LONG TERM GOAL #4   Title ind with HEP to maintain strength gains in  therapy.    Status Achieved      PT LONG TERM GOAL #5   Title improved left shoulder flexion in standing to 155 deg or greater to ease OH activities.    Status Achieved                 Plan - 01/25/21 1010    Clinical Impression Statement Pt will D/C today to HEP.  Pt has a comprehensive HEP in place for strength and goes to the gym regularly.  Session focused on review of all HEP and how to advance after discharge.  Pt will follow-up with MD in March and discuss progress at that time.    PT Next Visit Plan D/C PT to HEP    PT Home Exercise Plan [email or web address]    Recommended Other Services initial cert is signed    Consulted and Agree with Plan of Care Patient           Patient will benefit from skilled therapeutic intervention in order to improve the following deficits and impairments:     Visit Diagnosis: Stiffness of left shoulder, not elsewhere classified  Muscle weakness (generalized)     Problem List There are no problems to display for this patient. PHYSICAL THERAPY DISCHARGE SUMMARY  Visits from Start of Care: 13  Current functional level related to goals / functional outcomes: See above for current status.  Pt has HEP and gym exercises in place for continued gains.     Remaining deficits: See above for current deficits.  Lt>Rt shoulder pain.    Education / Equipment: HEP, osteoporosis education, advancement of gym exercise.   Plan: Patient agrees to discharge.  Patient goals were partially met. Patient is being discharged due to being pleased with the current functional level.  ?????         Sigurd Sos, PT 01/25/21 10:11 AM  [facility] [address], [place], [place], [address] Phone: [phone]   Fax:  [phone]  Name: Cory Richard. MRN: [id number] Date of Birth: June 05, 1941

## 2021-01-27 ENCOUNTER — Ambulatory Visit: Payer: Medicare Other | Admitting: Physical Therapy

## 2021-02-01 ENCOUNTER — Encounter: Payer: Self-pay | Admitting: Physical Therapy

## 2021-02-08 ENCOUNTER — Encounter: Payer: Medicare Other | Admitting: Physical Therapy

## 2021-02-10 ENCOUNTER — Encounter: Payer: Medicare Other | Admitting: Physical Therapy

## 2021-02-12 ENCOUNTER — Encounter: Payer: Medicare Other | Admitting: Physical Therapy

## 2021-02-15 ENCOUNTER — Encounter: Payer: Medicare Other | Admitting: Physical Therapy

## 2021-02-22 ENCOUNTER — Encounter: Payer: Medicare Other | Admitting: Physical Therapy

## 2021-08-13 ENCOUNTER — Encounter: Payer: Self-pay | Admitting: Physical Therapy

## 2021-08-13 ENCOUNTER — Ambulatory Visit: Payer: Medicare Other | Attending: Student | Admitting: Physical Therapy

## 2021-08-13 ENCOUNTER — Other Ambulatory Visit: Payer: Self-pay

## 2021-08-13 DIAGNOSIS — R2689 Other abnormalities of gait and mobility: Secondary | ICD-10-CM | POA: Insufficient documentation

## 2021-08-13 DIAGNOSIS — M25572 Pain in left ankle and joints of left foot: Secondary | ICD-10-CM | POA: Diagnosis present

## 2021-08-13 DIAGNOSIS — M25612 Stiffness of left shoulder, not elsewhere classified: Secondary | ICD-10-CM | POA: Insufficient documentation

## 2021-08-13 DIAGNOSIS — M6281 Muscle weakness (generalized): Secondary | ICD-10-CM | POA: Insufficient documentation

## 2021-08-13 NOTE — Therapy (Signed)
[facility] [address], [address] [place], [place], [address] Phone: [phone]   Fax:  [phone]  Physical Therapy Evaluation  Patient Details  Name: Cory Richard. MRN: [id number] Date of Birth: May 16, 1941 Referring Provider (PT): Jacinta Shoe, [place]   Encounter Date: 08/13/2021   PT End of Session - 08/13/21 1215     Visit Number 1    Date for PT Re-Evaluation 10/08/21    Authorization Type Medicare Part A and B, KX at visit 15    Progress Note Due on Visit 10    PT Start Time 1020    PT Stop Time 1105    PT Time Calculation (min) 45 min    Activity Tolerance Patient tolerated treatment well    Behavior During Therapy [facility] for tasks assessed/performed             History reviewed. No pertinent past medical history.  History reviewed. No pertinent surgical history.  There were no vitals filed for this visit.    Subjective Assessment - 08/13/21 1026     Subjective Pt referred to OPPT with ongoing Lt foot and ankle discomfort and stiffness mostly noted when walking.  Pain occurs initially with first steps and then improves but with prolonged walking gets worse again.  MD Rx lists posterior tibialis dysfunction.    Limitations Walking    How long can you walk comfortably? intial steps painful, then improves, then worsens close to 1 mile    Patient Stated Goals reduce pain with walking    Currently in Pain? Yes    Pain Score 5     Pain Location Foot    Pain Orientation Left;Lateral;Mid   dorsal aspect   Pain Type Chronic pain;Acute pain    Pain Radiating Towards along dorsal aspect of foot and lateral ankle    Pain Onset More than a month ago    Pain Frequency Intermittent    Aggravating Factors  walking    Pain Relieving Factors Advil    Effect of Pain on Daily Activities walking                [facility] PT Assessment - 08/13/21 0001       Assessment   Medical Diagnosis M65.062 (ICD-10-CM)  - Abscess of tendon sheath, left lower leg    Referring Provider (PT) Jacinta Shoe, PA-C    Onset Date/Surgical Date --   4 months ago   Next MD Visit 08/2021    Prior Therapy no      Precautions   Precautions None      Restrictions   Weight Bearing Restrictions No      Balance Screen   Has the patient fallen in the past 6 months No      Home Environment   Living Environment Private residence    Living Arrangements Spouse/significant other    Home Access Stairs to enter    Entrance Stairs-Number of Steps 3    Home Layout Two level      Prior Function   Level of Independence Independent    Vocation Retired    Leisure [employer], walking, golf      Observation/Other Assessments   Observations atrophy present Lt>Rt medial gastroc and posterior tibialis    Focus on Therapeutic Outcomes (FOTO)  63% goal 73%      ROM / Strength   AROM / PROM / Strength AROM;Strength      AROM   AROM Assessment  Site Ankle    Right/Left Ankle Right;Left    Right Ankle Dorsiflexion 12    Right Ankle Plantar Flexion 50    Right Ankle Inversion 15    Right Ankle Eversion 20    Left Ankle Dorsiflexion 10    Left Ankle Plantar Flexion 30    Left Ankle Inversion 15    Left Ankle Eversion 20      Strength   Strength Assessment Site Ankle    Right/Left Ankle Right    Right Ankle Dorsiflexion 4+/5    Right Ankle Plantar Flexion 3+/5    Right Ankle Inversion 3+/5    Right Ankle Eversion 4+/5      Palpation   Palpation comment Lt lateral ankle and medial post tib tendon                        Objective measurements completed on examination: See above findings.               PT Education - 08/13/21 1223     Education Details Access Code: [email or web address]    Person(s) Educated Patient    Methods Explanation;Demonstration;Handout    Comprehension Verbalized understanding;Returned demonstration              PT Short Term Goals - 08/13/21 1211       PT SHORT  TERM GOAL #1   Title Ind with initial HEP    Baseline -    Time 3    Period Weeks    Status New    Target Date 09/03/21      PT SHORT TERM GOAL #2   Title Pt will be able to demo arch lift to achieve a more neutral ankle and foot position.    Time 3    Period Weeks    Status New    Target Date 09/03/21      PT SHORT TERM GOAL #3   Title Pt will achieve at least 4-/5 strength in Lt ankle inversion and be able to perform at least 10 standing double heel raises WITH single leg eccentric lowering on Lt LE only.    Time 3    Period Weeks    Status New    Target Date 09/03/21      PT SHORT TERM GOAL #4   Title pt will report improved pain with walking for exercise by at least 25%    Time 3    Period Weeks    Status New    Target Date 09/03/21               PT Long Term Goals - 08/13/21 1205       PT LONG TERM GOAL #1   Title pt will be ind with advanced HEP and understand how to safely progress    Time 8    Period Weeks    Status New    Target Date 10/08/21      PT LONG TERM GOAL #2   Title Improved FOTO score to >/= 73% to demo greater function of Lt foot and ankle.    Baseline 63%    Time 8    Period Weeks    Status New    Target Date 10/08/21      PT LONG TERM GOAL #3   Title Pt will achieve at least 4/5 Lt ankle inversion strength and be able to perform at least 10 Lt LE single leg heel raises to demo improved  Lt ankle strength for gait stability.    Baseline ankle inversion 3/5, unable to perform single heel raise, lower excursion of Lt LE lift with double heel raise    Time 8    Period Weeks    Status New    Target Date 10/08/21      PT LONG TERM GOAL #4   Title improved Lt ankle ROM for ankle PF to at least 40 deg and ankle DF to at least 12 deg for improved mobility for functional tasks such as squat, stairs and gait.    Baseline PF 30 deg, DF 10 deg    Time 8    Period Weeks    Status New    Target Date 10/08/21      PT LONG TERM GOAL #5    Title Pt will report at least 50% less Lt ankle pain with walking for daily tasks and exercise.    Time 8    Period Weeks    Status New    Target Date 10/08/21                    Plan - 08/13/21 1215     Clinical Impression Statement Pt referred to OPPT with history of Lt foot and ankle pain x 4 months with insidious onset.  He reports pain along dorsal aspect of foot and lateral ankle when walking.  He has no pain at rest.  He has significant prontation Lt>Rt with lack of ankle supination during all phases of gait when assessed without shoes.  He wears Hokas and has superfeet inserts which are new.  He has limited Lt ankle ROM into PF>DF as compared to Rt ankle with mild capsular pattern.  Joints of midfoot are slightly hypomobile.  Pt has significant atrophy of bil gastroc Lt>Rt and posterior tibialis on Lt with signif ankle inversion and plantar flexion weakness (3/5 inversion, 4/5 plantar flexion.)  He has tenderness along medial ankle>lateral ankle along tendons contributing to windlass mechanism.  Posterior tib tendon most notably tender.  PT intiated arch squeeze and ankle strengthening today for initial HEP.  Pt is very active and works out at [employer].  PT recommends 1x/week to address ankle mobility, weakness and pain to optimize function with less pain.    Personal Factors and Comorbidities Time since onset of injury/illness/exacerbation;Comorbidity 1;Age    Comorbidities signif pronation, atrophy possibly from recent steroid use    Examination-Activity Limitations Locomotion Level    Examination-Participation Restrictions Community Activity    Stability/Clinical Decision Making Stable/Uncomplicated    Clinical Decision Making Low    Rehab Potential Excellent    PT Frequency 1x / week    PT Duration 8 weeks    PT Treatment/Interventions ADLs/Self Care Home Management;Cryotherapy;Iontophoresis 4mg /ml Dexamethasone;Gait training;Functional mobility training;Therapeutic  activities;Therapeutic exercise;Neuromuscular re-education;Patient/family education;Manual techniques;Passive range of motion;Joint Manipulations;Taping;Dry needling    PT Next Visit Plan review HEP, Lt arch and inversion strengthening, weight shifting into Lt LE with focus on arch and ankle stabilization, double heel raise to Lt eccentric lowering, Lt ankle mobs for PF/DF ROM, leg press for ankle PF heel raises, assess need for gastroc stretching (may need towel under arch)    PT Home Exercise Plan Access Code: [email or web address]    Consulted and Agree with Plan of Care Patient             Patient will benefit from skilled therapeutic intervention in order to improve the following deficits and impairments:  Abnormal gait, Decreased range  of motion, Postural dysfunction, Decreased strength, Hypomobility, Pain  Visit Diagnosis: Pain in left ankle and joints of left foot - Plan: PT plan of care cert/re-cert  Muscle weakness (generalized) - Plan: PT plan of care cert/re-cert  Other abnormalities of gait and mobility - Plan: PT plan of care cert/re-cert     Problem List There are no problems to display for this patient.   Loistine Simas Rasool Rommel, PT 08/13/21 12:30 PM   [facility] [address], [address] [place], [place], [address] Phone: [phone]   Fax:  [phone]  Name: Breyer Tejera. MRN: [id number] Date of Birth: [birth year]

## 2021-08-13 NOTE — Patient Instructions (Signed)
Access Code: [email or web address] URL: [email or web address] Date: 08/13/2021 Prepared by: Loistine Simas Kaho Selle  Exercises Arch Lifting - 1 x daily - 7 x weekly - 1 sets - 15 reps - 3 hold Isometric Ankle Inversion - 1 x daily - 7 x weekly - 1 sets - 10 reps - 3 hold Heel Raises with Counter Support - 1 x daily - 7 x weekly - 2 sets - 10 reps Standing Heel Raise with Toes Turned Out - 1 x daily - 7 x weekly - 1 sets - 10 reps Standing Heel Raise with Toes Turned In - 1 x daily - 7 x weekly - 1 sets - 10 reps

## 2021-08-18 ENCOUNTER — Other Ambulatory Visit: Payer: Self-pay

## 2021-08-18 ENCOUNTER — Encounter: Payer: Self-pay | Admitting: Physical Therapy

## 2021-08-18 ENCOUNTER — Ambulatory Visit: Payer: Medicare Other | Admitting: Physical Therapy

## 2021-08-18 DIAGNOSIS — M6281 Muscle weakness (generalized): Secondary | ICD-10-CM

## 2021-08-18 DIAGNOSIS — M25612 Stiffness of left shoulder, not elsewhere classified: Secondary | ICD-10-CM

## 2021-08-18 DIAGNOSIS — R2689 Other abnormalities of gait and mobility: Secondary | ICD-10-CM

## 2021-08-18 DIAGNOSIS — M25572 Pain in left ankle and joints of left foot: Secondary | ICD-10-CM

## 2021-08-18 NOTE — Therapy (Signed)
[facility] [address] [place], [place], [address] Phone: [phone]   Fax:  [phone]  Physical Therapy Treatment  Patient Details  Name: Cory Richard. MRN: [id number] Date of Birth: 01/22/1941 Referring Provider (PT): Jacinta Shoe, [place]   Encounter Date: 08/18/2021   PT End of Session - 08/18/21 1045     Visit Number 2    Date for PT Re-Evaluation 10/08/21    Authorization Type Medicare Part A and B, KX at visit 15    Authorization - Number of Visits 2    Progress Note Due on Visit 10    PT Start Time 0845    PT Stop Time 0930    PT Time Calculation (min) 45 min    Activity Tolerance Patient tolerated treatment well;No increased pain    Behavior During Therapy [facility] for tasks assessed/performed             History reviewed. No pertinent past medical history.  History reviewed. No pertinent surgical history.  There were no vitals filed for this visit.   Subjective Assessment - 08/18/21 0847     Subjective Pt states that things are going well. He is doing the leg press at home up to 80lb. Just mild discomfort when ambulating into the clinic today, but he provided no rating.    Limitations Walking    How long can you walk comfortably? intial steps painful, then improves, then worsens close to 1 mile    Patient Stated Goals reduce pain with walking    Currently in Pain? Other (Comment)   mild on dorsal aspect of foot   Pain Onset More than a month ago                               [facility] Adult PT Treatment/Exercise - 08/18/21 0001       Exercises   Exercises Ankle      Manual Therapy   Manual Therapy Soft tissue mobilization;Joint mobilization    Joint Mobilization grade III-IV midfoot mobilization, Grade III talocrural joint mobilization to increase plantarflexion    Soft tissue mobilization STM Lt lateral gastroc, peroneals      Ankle Exercises: Standing   Heel  Raises Both;15 reps    Heel Raises Limitations weight shifted to the Lt    Other Standing Ankle Exercises weight shifting Lt/Rt on foam pad x15 reps PT cuing to avoid knee hyperextension      Ankle Exercises: Supine   Isometrics Lt ankle eversion isometric 5x5 sec hold    Other Supine Ankle Exercises Lt ankle inversion yellow TB x10 reps, unble to complete eversion with yellow TB without increase in lateral foot pain.                    PT Education - 08/18/21 1045     Education Details updated HEP    Person(s) Educated Patient    Methods Explanation;Handout;Verbal cues    Comprehension Verbalized understanding;Returned demonstration              PT Short Term Goals - 08/13/21 1211       PT SHORT TERM GOAL #1   Title Ind with initial HEP    Baseline -    Time 3    Period Weeks    Status New    Target Date 09/03/21      PT SHORT TERM GOAL #2  Title Pt will be able to demo arch lift to achieve a more neutral ankle and foot position.    Time 3    Period Weeks    Status New    Target Date 09/03/21      PT SHORT TERM GOAL #3   Title Pt will achieve at least 4-/5 strength in Lt ankle inversion and be able to perform at least 10 standing double heel raises WITH single leg eccentric lowering on Lt LE only.    Time 3    Period Weeks    Status New    Target Date 09/03/21      PT SHORT TERM GOAL #4   Title pt will report improved pain with walking for exercise by at least 25%    Time 3    Period Weeks    Status New    Target Date 09/03/21               PT Long Term Goals - 08/13/21 1205       PT LONG TERM GOAL #1   Title pt will be ind with advanced HEP and understand how to safely progress    Time 8    Period Weeks    Status New    Target Date 10/08/21      PT LONG TERM GOAL #2   Title Improved FOTO score to >/= 73% to demo greater function of Lt foot and ankle.    Baseline 63%    Time 8    Period Weeks    Status New    Target Date  10/08/21      PT LONG TERM GOAL #3   Title Pt will achieve at least 4/5 Lt ankle inversion strength and be able to perform at least 10 Lt LE single leg heel raises to demo improved Lt ankle strength for gait stability.    Baseline ankle inversion 3/5, unable to perform single heel raise, lower excursion of Lt LE lift with double heel raise    Time 8    Period Weeks    Status New    Target Date 10/08/21      PT LONG TERM GOAL #4   Title improved Lt ankle ROM for ankle PF to at least 40 deg and ankle DF to at least 12 deg for improved mobility for functional tasks such as squat, stairs and gait.    Baseline PF 30 deg, DF 10 deg    Time 8    Period Weeks    Status New    Target Date 10/08/21      PT LONG TERM GOAL #5   Title Pt will report at least 50% less Lt ankle pain with walking for daily tasks and exercise.    Time 8    Period Weeks    Status New    Target Date 10/08/21                   Plan - 08/18/21 1047     Clinical Impression Statement Pt is doing well with his HEP. He had some difficulty with arch lifts, requiring moderate PT cuing to decrease toe flexion compensation. PT worked on ankle and midfoot mobility as well as soft tissue restrictions in the lateral gastroc region. Pt was able to complete resisted ankle inversion without pain, but required modification to eversion with only isometric hold completed today. Pt had good understanding of all exercise updates and denied increase in pain end of session.  Personal Factors and Comorbidities Time since onset of injury/illness/exacerbation;Comorbidity 1;Age    Comorbidities signif pronation, atrophy possibly from recent steroid use    Examination-Activity Limitations Locomotion Level    Examination-Participation Restrictions Community Activity    Stability/Clinical Decision Making Stable/Uncomplicated    Rehab Potential Excellent    PT Frequency 1x / week    PT Duration 8 weeks    PT Treatment/Interventions  ADLs/Self Care Home Management;Cryotherapy;Iontophoresis 4mg /ml Dexamethasone;Gait training;Functional mobility training;Therapeutic activities;Therapeutic exercise;Neuromuscular re-education;Patient/family education;Manual techniques;Passive range of motion;Joint Manipulations;Taping;Dry needling    PT Next Visit Plan Lt arch in standing if able, inversion/eversion strengthening, double heel raise to Lt eccentric lowering, Lt ankle mobs for PF/DF ROM as needed    PT Home Exercise Plan Access Code: [email or web address]    Consulted and Agree with Plan of Care Patient             Patient will benefit from skilled therapeutic intervention in order to improve the following deficits and impairments:  Abnormal gait, Decreased range of motion, Postural dysfunction, Decreased strength, Hypomobility, Pain  Visit Diagnosis: Pain in left ankle and joints of left foot  Muscle weakness (generalized)  Other abnormalities of gait and mobility  Stiffness of left shoulder, not elsewhere classified     Problem List There are no problems to display for this patient.  10:58 AM,08/18/21 08/20/21 PT, DPT [facility]  [phone]   [facility] [address] [place], [place], [place] Phone: [phone]   Fax:  [phone]  Name: Cory Richard. MRN: Samuel Germany Date of Birth: March 05, 1941

## 2021-08-18 NOTE — Patient Instructions (Signed)
Access Code: [email or web address] URL: [email or web address] Date: 08/18/2021 Prepared by: [facility]  Exercises Arch Lifting - 1 x daily - 7 x weekly - 1 sets - 15 reps - 3 hold Isometric Ankle Inversion - 1 x daily - 7 x weekly - 1 sets - 10 reps - 3 hold Heel Raises with Counter Support - 1 x daily - 7 x weekly - 2 sets - 10 reps Standing Heel Raise with Toes Turned Out - 1 x daily - 7 x weekly - 1 sets - 10 reps Standing Heel Raise with Toes Turned In - 1 x daily - 7 x weekly - 1 sets - 10 reps Seated Heel Raise - 1 x daily - 7 x weekly - 2 sets - 10 reps   [facility] [address], [address] [place], [place] [address] Phone # [phone] Fax [phone]

## 2021-08-30 ENCOUNTER — Other Ambulatory Visit: Payer: Self-pay

## 2021-08-30 ENCOUNTER — Ambulatory Visit: Payer: Medicare Other | Attending: Student

## 2021-08-30 DIAGNOSIS — R2689 Other abnormalities of gait and mobility: Secondary | ICD-10-CM | POA: Insufficient documentation

## 2021-08-30 DIAGNOSIS — M25612 Stiffness of left shoulder, not elsewhere classified: Secondary | ICD-10-CM | POA: Insufficient documentation

## 2021-08-30 DIAGNOSIS — M6281 Muscle weakness (generalized): Secondary | ICD-10-CM | POA: Insufficient documentation

## 2021-08-30 DIAGNOSIS — M25572 Pain in left ankle and joints of left foot: Secondary | ICD-10-CM | POA: Insufficient documentation

## 2021-08-30 NOTE — Therapy (Signed)
[facility] [address], [address] [place], [place], [address] Phone: [phone]   Fax:  [phone]  Physical Therapy Treatment  Patient Details  Name: Cory Richard. MRN: [id number] Date of Birth: 1941-02-27 Referring Provider (PT): Jacinta Shoe, [place]   Encounter Date: 08/30/2021   PT End of Session - 08/30/21 1013     Visit Number 3    Date for PT Re-Evaluation 10/08/21    Authorization Type Medicare Part A and B, [place]X at visit 15    Authorization - Number of Visits 3    Progress Note Due on Visit 10    PT Start Time 0931    PT Stop Time 1013    PT Time Calculation (min) 42 min    Activity Tolerance Patient tolerated treatment well;No increased pain    Behavior During Therapy [facility] for tasks assessed/performed             History reviewed. No pertinent past medical history.  History reviewed. No pertinent surgical history.  There were no vitals filed for this visit.   Subjective Assessment - 08/30/21 0931     Subjective My foot is feeling better.  The rotation is not there yet.    Currently in Pain? No/denies                               [facility] Adult PT Treatment/Exercise - 08/30/21 0001       Manual Therapy   Manual Therapy Soft tissue mobilization;Joint mobilization    Joint Mobilization grade III-IV midfoot mobilization, Grade III talocrural joint mobilization to increase plantarflexion    Soft tissue mobilization STM Lt lateral gastroc, peroneals      Ankle Exercises: Standing   Rocker Board 3 minutes    Heel Raises Both   feet turned in, out and neutral x 20 each   Heel Raises Limitations weight shifted to the Lt    Other Standing Ankle Exercises weight shifting Lt/Rt on mini-tramp  x1 min 3 ways reps. PT cuing to avoid knee hyperextension      Ankle Exercises: Seated   Heel Raises 20 reps   5# kettle bell on knee                      PT Short Term  Goals - 08/30/21 0950       PT SHORT TERM GOAL #1   Title Ind with initial HEP    Status Achieved      PT SHORT TERM GOAL #2   Title Pt will be able to demo arch lift to achieve a more neutral ankle and foot position.    Status On-going      PT SHORT TERM GOAL #3   Title Pt will achieve at least 4-/5 strength in Lt ankle inversion and be able to perform at least 10 standing double heel raises WITH single leg eccentric lowering on Lt LE only.    Status On-going      PT SHORT TERM GOAL #4   Title pt will report improved pain with walking for exercise by at least 25%    Status Achieved               PT Long Term Goals - 08/13/21 1205       PT LONG TERM GOAL #1   Title pt will be ind with advanced HEP and understand how to  safely progress    Time 8    Period Weeks    Status New    Target Date 10/08/21      PT LONG TERM GOAL #2   Title Improved FOTO score to >/= 73% to demo greater function of Lt foot and ankle.    Baseline 63%    Time 8    Period Weeks    Status New    Target Date 10/08/21      PT LONG TERM GOAL #3   Title Pt will achieve at least 4/5 Lt ankle inversion strength and be able to perform at least 10 Lt LE single leg heel raises to demo improved Lt ankle strength for gait stability.    Baseline ankle inversion 3/5, unable to perform single heel raise, lower excursion of Lt LE lift with double heel raise    Time 8    Period Weeks    Status New    Target Date 10/08/21      PT LONG TERM GOAL #4   Title improved Lt ankle ROM for ankle PF to at least 40 deg and ankle DF to at least 12 deg for improved mobility for functional tasks such as squat, stairs and gait.    Baseline PF 30 deg, DF 10 deg    Time 8    Period Weeks    Status New    Target Date 10/08/21      PT LONG TERM GOAL #5   Title Pt will report at least 50% less Lt ankle pain with walking for daily tasks and exercise.    Time 8    Period Weeks    Status New    Target Date 10/08/21                    Plan - 08/30/21 0954     Clinical Impression Statement Pt is doing well with his HEP.  Pt denies any significant Lt ankle or foot pain now with walking and exercising, meeting STG.  PT worked on ankle and midfoot mobility as well as soft tissue restrictions in the lateral gastroc region. Pt is challenged by gastroc activation with heel raises with shift to Lt. Verbal and tactile cues for alignment and body weight distribution with standing heel raises with toes in and out.  Pt will continue to benefit from skilled PT to address Lt foot and ankle mobility, functional strength and tissue mobility.    PT Frequency 1x / week    PT Duration 8 weeks    PT Treatment/Interventions ADLs/Self Care Home Management;Cryotherapy;Iontophoresis 4mg /ml Dexamethasone;Gait training;Functional mobility training;Therapeutic activities;Therapeutic exercise;Neuromuscular re-education;Patient/family education;Manual techniques;Passive range of motion;Joint Manipulations;Taping;Dry needling    PT Next Visit Plan inversion/eversion strengthening, double heel raise to Lt eccentric lowering, Lt ankle mobs for PF/DF ROM as needed    PT Home Exercise Plan Access Code: [email or web address]    Recommended Other Services initial cert is signed    Consulted and Agree with Plan of Care Patient             Patient will benefit from skilled therapeutic intervention in order to improve the following deficits and impairments:  Abnormal gait, Decreased range of motion, Postural dysfunction, Decreased strength, Hypomobility, Pain  Visit Diagnosis: Pain in left ankle and joints of left foot  Muscle weakness (generalized)  Other abnormalities of gait and mobility  Stiffness of left shoulder, not elsewhere classified     Problem List There are no problems to display for this  patient.   Cory Richard, PT 08/30/21 10:18 AM   [facility] [address]  [address] [place], [place], [address] Phone: [phone]   Fax:  [phone]  Name: Cory Richard. MRN: [id number] Date of Birth: 15-Apr-1941

## 2021-09-06 ENCOUNTER — Ambulatory Visit: Payer: Medicare Other

## 2021-09-06 ENCOUNTER — Other Ambulatory Visit: Payer: Self-pay

## 2021-09-06 DIAGNOSIS — M6281 Muscle weakness (generalized): Secondary | ICD-10-CM

## 2021-09-06 DIAGNOSIS — R2689 Other abnormalities of gait and mobility: Secondary | ICD-10-CM

## 2021-09-06 DIAGNOSIS — M25572 Pain in left ankle and joints of left foot: Secondary | ICD-10-CM

## 2021-09-06 NOTE — Therapy (Signed)
[facility] [address] [place], [place], [address] Phone: [phone]   Fax:  [phone]  Physical Therapy Treatment  Patient Details  Name: Cory Richard. MRN: [id number] Date of Birth: 1941-10-19 Referring Provider (PT): Jacinta Shoe, [place]   Encounter Date: 09/06/2021   PT End of Session - 09/06/21 1012     Visit Number 4    Date for PT Re-Evaluation 10/08/21    Authorization Type Medicare Part A and B, KX at visit 15    Authorization - Number of Visits 4    Progress Note Due on Visit 10    PT Start Time 0932    PT Stop Time 1016    PT Time Calculation (min) 44 min    Activity Tolerance Patient tolerated treatment well;No increased pain    Behavior During Therapy [facility] for tasks assessed/performed             History reviewed. No pertinent past medical history.  History reviewed. No pertinent surgical history.  There were no vitals filed for this visit.   Subjective Assessment - 09/06/21 0930     Subjective I feel good with walking.  Turning my ankle in is still challenging.    How long can you walk comfortably? intial steps painful, then improves, then worsens close to 1 mile    Currently in Pain? No/denies                               [facility] Adult PT Treatment/Exercise - 09/06/21 0001       Manual Therapy   Manual Therapy Soft tissue mobilization;Joint mobilization    Joint Mobilization grade III-IV midfoot mobilization, Grade III talocrural joint mobilization to increase plantarflexion    Soft tissue mobilization STM Lt lateral gastroc, peroneals      Ankle Exercises: Standing   Rocker Board 3 minutes    Rebounder on Lt on minitramp 3x20 seconds with UE support as needed    Heel Raises Both   feet turned in, out and neutral x 20 each   Heel Raises Limitations weight shifted to the Lt    Other Standing Ankle Exercises weight shifting Lt/Rt on mini-tramp  x1 min  3 ways reps. PT cuing to avoid knee hyperextension      Ankle Exercises: Supine   T-Band inversion Lt x20 with yrellow band    Other Supine Ankle Exercises seated inversion-no resistance x20 with tactile cues for technique                       PT Short Term Goals - 09/06/21 0934       PT SHORT TERM GOAL #1   Title Ind with initial HEP    Status Achieved      PT SHORT TERM GOAL #3   Title Pt will achieve at least 4-/5 strength in Lt ankle inversion and be able to perform at least 10 standing double heel raises WITH single leg eccentric lowering on Lt LE only.    Baseline 4-/5 eversion on Lt.  Not able to lower eccentrically on Lt    Status On-going      PT SHORT TERM GOAL #4   Title pt will report improved pain with walking for exercise by at least 25%    Status Achieved               PT Long  Term Goals - 08/13/21 1205       PT LONG TERM GOAL #1   Title pt will be ind with advanced HEP and understand how to safely progress    Time 8    Period Weeks    Status New    Target Date 10/08/21      PT LONG TERM GOAL #2   Title Improved FOTO score to >/= 73% to demo greater function of Lt foot and ankle.    Baseline 63%    Time 8    Period Weeks    Status New    Target Date 10/08/21      PT LONG TERM GOAL #3   Title Pt will achieve at least 4/5 Lt ankle inversion strength and be able to perform at least 10 Lt LE single leg heel raises to demo improved Lt ankle strength for gait stability.    Baseline ankle inversion 3/5, unable to perform single heel raise, lower excursion of Lt LE lift with double heel raise    Time 8    Period Weeks    Status New    Target Date 10/08/21      PT LONG TERM GOAL #4   Title improved Lt ankle ROM for ankle PF to at least 40 deg and ankle DF to at least 12 deg for improved mobility for functional tasks such as squat, stairs and gait.    Baseline PF 30 deg, DF 10 deg    Time 8    Period Weeks    Status New    Target Date  10/08/21      PT LONG TERM GOAL #5   Title Pt will report at least 50% less Lt ankle pain with walking for daily tasks and exercise.    Time 8    Period Weeks    Status New    Target Date 10/08/21                   Plan - 09/06/21 0949     Clinical Impression Statement Pt denies any significant Lt ankle or foot pain now with walking and exercising.  Pt with improved Lt ankle inversion strength and was able to perform with yellow band today.  Pt does require tactile and verbal cues for alignment with Lt ankle inversion.  Pt curls toes to achieve this motion on the Lt.  PT worked on ankle and midfoot mobility as well as soft tissue restrictions in the lateral gastroc region. Pt is challenged by gastroc activation with heel raises with shift to Lt. Pt will continue to benefit from skilled PT to address Lt foot and ankle mobility, functional strength and tissue mobility.    PT Frequency 1x / week    PT Duration 8 weeks    PT Treatment/Interventions ADLs/Self Care Home Management;Cryotherapy;Iontophoresis 4mg /ml Dexamethasone;Gait training;Functional mobility training;Therapeutic activities;Therapeutic exercise;Neuromuscular re-education;Patient/family education;Manual techniques;Passive range of motion;Joint Manipulations;Taping;Dry needling    PT Next Visit Plan inversion/eversion strengthening, double heel raise to Lt eccentric lowering, Lt ankle mobs for PF/DF ROM as needed    PT Home Exercise Plan Access Code: [email or web address]    Consulted and Agree with Plan of Care Patient             Patient will benefit from skilled therapeutic intervention in order to improve the following deficits and impairments:  Abnormal gait, Decreased range of motion, Postural dysfunction, Decreased strength, Hypomobility, Pain  Visit Diagnosis: Pain in left ankle and joints of left foot  Muscle weakness (generalized)  Other abnormalities of gait and mobility     Problem List There are no problems  to display for this patient.  [name], PT 09/06/21 10:23 AM   [facility] [address], [address] [place], [place], [address] Phone: [phone]   Fax:  [phone]  Name: Milford Cilento. MRN: [id number] Date of Birth: 08/25/1941

## 2021-09-13 ENCOUNTER — Ambulatory Visit: Payer: Medicare Other | Admitting: Physical Therapy

## 2021-09-13 ENCOUNTER — Other Ambulatory Visit: Payer: Self-pay

## 2021-09-13 ENCOUNTER — Encounter: Payer: Self-pay | Admitting: Physical Therapy

## 2021-09-13 DIAGNOSIS — M25572 Pain in left ankle and joints of left foot: Secondary | ICD-10-CM | POA: Diagnosis not present

## 2021-09-13 DIAGNOSIS — R2689 Other abnormalities of gait and mobility: Secondary | ICD-10-CM

## 2021-09-13 DIAGNOSIS — M6281 Muscle weakness (generalized): Secondary | ICD-10-CM

## 2021-09-13 NOTE — Therapy (Signed)
[facility] [address] [place], [place], [address] Phone: [phone]   Fax:  [phone]  Physical Therapy Treatment  Patient Details  Name: Cory Richard. MRN: [id number] Date of Birth: Feb 16, 1941 Referring Provider (PT): Corky Sing, [place]   Encounter Date: 09/13/2021   PT End of Session - 09/13/21 1100     Visit Number 5    Date for PT Re-Evaluation 10/08/21    Authorization Type Medicare Part A and B, KX at visit 15    Authorization - Number of Visits 5    Progress Note Due on Visit 10    PT Start Time 1020    PT Stop Time 1100    PT Time Calculation (min) 40 min    Activity Tolerance Patient tolerated treatment well;No increased pain    Behavior During Therapy [facility] for tasks assessed/performed             History reviewed. No pertinent past medical history.  History reviewed. No pertinent surgical history.  There were no vitals filed for this visit.   Subjective Assessment - 09/13/21 1017     Subjective Strength is coming along and I have less pain.  Continuing to work out at gym.    Limitations Walking    How long can you walk comfortably? intial steps painful, then improves, then worsens close to 1 mile    Patient Stated Goals reduce pain with walking    Currently in Pain? No/denies    Pain Orientation Lateral;Left    Pain Type Chronic pain;Acute pain    Pain Radiating Towards lateral aspect of ankle with ankle inversion    Pain Onset More than a month ago    Pain Frequency Intermittent    Aggravating Factors  when turning ankle into inversion                [facility] PT Assessment - 09/13/21 0001       ROM / Strength   AROM / PROM / Strength AROM      AROM   AROM Assessment Site Ankle    Right/Left Ankle Left    Left Ankle Dorsiflexion 15    Left Ankle Plantar Flexion 40      Strength   Strength Assessment Site Ankle    Right/Left Ankle Left    Left Ankle Plantar  Flexion 4-/5    Left Ankle Inversion 4-/5                           [facility] Adult PT Treatment/Exercise - 09/13/21 0001       Exercises   Exercises Ankle      Manual Therapy   Manual Therapy Joint mobilization;Soft tissue mobilization    Joint Mobilization grade III-IV midfoot mobilization, Grade III talocrural joint mobilization to increase plantarflexion, subtalar distraction Gr III/IV    Soft tissue mobilization STM Lt lateral gastroc, peroneals      Ankle Exercises: Standing   Rocker Board 3 minutes    Rebounder on Lt on minitramp 3x20 seconds with UE support as needed    Heel Raises Both    Heel Raises Limitations up with both, lower on Lt only to fatigue, then bil up/down with 60-70% weigth in Lt LE      Ankle Exercises: Seated   Other Seated Ankle Exercises Lt ankle inversion ball squeeze x 20 reps, hold 3", PT TC along tib posterior to give feedback for  activation    Other Seated Ankle Exercises Lt ankle inversion holds yellow band: PT assisted position then cued hold as stretched band into more resistance, slow eccentric release by Pt x 10 reps                       PT Short Term Goals - 09/13/21 1052       PT SHORT TERM GOAL #1   Title Ind with initial HEP    Status Achieved      PT SHORT TERM GOAL #2   Title Pt will be able to demo arch lift to achieve a more neutral ankle and foot position.    Status Achieved      PT SHORT TERM GOAL #3   Title Pt will achieve at least 4-/5 strength in Lt ankle inversion and be able to perform at least 10 standing double heel raises WITH single leg eccentric lowering on Lt LE only.    Status Achieved      PT SHORT TERM GOAL #4   Title pt will report improved pain with walking for exercise by at least 25%    Status Achieved               PT Long Term Goals - 09/13/21 1053       PT LONG TERM GOAL #1   Title pt will be ind with advanced HEP and understand how to safely progress    Status  On-going      PT LONG TERM GOAL #3   Title Pt will achieve at least 4/5 Lt ankle inversion strength and be able to perform at least 10 Lt LE single leg heel raises to demo improved Lt ankle strength for gait stability.    Baseline can perform with limited height    Status On-going      PT LONG TERM GOAL #4   Title improved Lt ankle ROM for ankle PF to at least 40 deg and ankle DF to at least 12 deg for improved mobility for functional tasks such as squat, stairs and gait.    Baseline 15DF , 40 PF    Status Achieved      PT LONG TERM GOAL #5   Title Pt will report at least 50% less Lt ankle pain with walking for daily tasks and exercise.    Status Achieved                   Plan - 09/13/21 1100     Clinical Impression Statement Pt is making great progress in strength and ROM.  He has met all STGs and most LTGs as of today.  He has much improved muscle bulk and contour of Lt gastroc and is able to perform single leg heel raises with limited height x 10 reps.  He is able to perform bil heel raise with Lt single eccentric lowering with good control.  Ankle ROM has improved to 40 deg PF and 15 deg DF meeting LTG.  Pt is compliant with HEP at the gym.  He has signif less pain with only mild tenderness present along peroneal tendons proximal to ankle on Lt.  Pt reports he will feel ready to d/c at next visit.    Comorbidities signif pronation, atrophy possibly from recent steroid use    PT Frequency 1x / week    PT Duration 8 weeks    PT Treatment/Interventions ADLs/Self Care Home Management;Cryotherapy;Iontophoresis 20m/ml Dexamethasone;Gait training;Functional mobility training;Therapeutic activities;Therapeutic  exercise;Neuromuscular re-education;Patient/family education;Manual techniques;Passive range of motion;Joint Manipulations;Taping;Dry needling    PT Next Visit Plan d/c likely per Pt readiness, finalize HEP for ankle strength, do FOTO for LTG    PT Home Exercise Plan Access  Code: [email or web address]    Consulted and Agree with Plan of Care Patient             Patient will benefit from skilled therapeutic intervention in order to improve the following deficits and impairments:     Visit Diagnosis: Pain in left ankle and joints of left foot  Muscle weakness (generalized)  Other abnormalities of gait and mobility     Problem List There are no problems to display for this patient.  Venetia Night Beuhring, PT 09/13/21 11:04 AM   [facility] [address], [place], [place], [address] Phone: [phone]   Fax:  [phone]  Name: Cory Richard. MRN: [id number] Date of Birth: 12/08/1941

## 2021-09-21 ENCOUNTER — Other Ambulatory Visit: Payer: Self-pay

## 2021-09-21 ENCOUNTER — Ambulatory Visit: Payer: Medicare Other | Attending: Student

## 2021-09-21 DIAGNOSIS — R2689 Other abnormalities of gait and mobility: Secondary | ICD-10-CM | POA: Diagnosis present

## 2021-09-21 DIAGNOSIS — M6281 Muscle weakness (generalized): Secondary | ICD-10-CM | POA: Insufficient documentation

## 2021-09-21 DIAGNOSIS — M25572 Pain in left ankle and joints of left foot: Secondary | ICD-10-CM | POA: Diagnosis present

## 2021-09-21 NOTE — Therapy (Signed)
[place] [facility] [place], [place], [address] Phone:     Fax:     Physical Therapy Treatment  Patient Details  Name: Laymon Stockert. MRN: [id number] Date of Birth: 1941-12-08 Referring Provider (PT): Corky Sing, [place]   Encounter Date: 09/21/2021   PT End of Session - 09/21/21 0925     Visit Number 6    Date for PT Re-Evaluation 10/08/21    Authorization Type Medicare Part A and B, KX at visit 15    Authorization - Number of Visits 6    Progress Note Due on Visit 10    PT Start Time 0846    PT Stop Time 0924    PT Time Calculation (min) 38 min    Activity Tolerance Patient tolerated treatment well;No increased pain    Behavior During Therapy [facility] for tasks assessed/performed             History reviewed. No pertinent past medical history.  History reviewed. No pertinent surgical history.  There were no vitals filed for this visit.   Subjective Assessment - 09/21/21 0849     Subjective I was feeling ready to D/C but now I'm not sure.  I was aching yesterday after walking.    Patient Stated Goals reduce pain with walking    Currently in Pain? No/denies                               [facility] Adult PT Treatment/Exercise - 09/21/21 0001       Manual Therapy   Manual Therapy Joint mobilization;Soft tissue mobilization    Joint Mobilization grade III-IV midfoot mobilization, Grade III talocrural joint mobilization to increase plantarflexion, subtalar distraction Gr III/IV    Soft tissue mobilization STM Lt lateral gastroc, peroneals      Ankle Exercises: Standing   Rocker Board 3 minutes   PF/DF and inversion/eversion   Rebounder on Lt on minitramp 3x20 seconds with UE support as needed    Heel Raises Both    Heel Raises Limitations up with both, lower on Lt only to fatigue, then bil up/down with 60-70% weigth in Lt LE                       PT Short Term  Goals - 09/21/21 0093       PT SHORT TERM GOAL #4   Title pt will report improved pain with walking for exercise by at least 25%    Status Achieved               PT Long Term Goals - 09/13/21 1053       PT LONG TERM GOAL #1   Title pt will be ind with advanced HEP and understand how to safely progress    Status On-going      PT LONG TERM GOAL #3   Title Pt will achieve at least 4/5 Lt ankle inversion strength and be able to perform at least 10 Lt LE single leg heel raises to demo improved Lt ankle strength for gait stability.    Baseline can perform with limited height    Status On-going      PT LONG TERM GOAL #4   Title improved Lt ankle ROM for ankle PF to at least 40 deg and ankle DF to at least 12 deg for improved mobility for functional  tasks such as squat, stairs and gait.    Baseline 15DF , 40 PF    Status Achieved      PT LONG TERM GOAL #5   Title Pt will report at least 50% less Lt ankle pain with walking for daily tasks and exercise.    Status Achieved                   Plan - 09/21/21 0858     Clinical Impression Statement Pt is making great progress in strength and ROM.  He has met all STGs and most LTGs as of last week.  He has much improved muscle bulk and contour of Lt gastroc and is able to perform single leg heel raises with limited height x 10 reps.  He is able to perform bil heel raise with Lt single eccentric lowering with good control.  Ankle ROM has improved to 40 deg PF and 15 deg DF meeting LTG last week.  Pt is compliant with HEP at the gym.  Pt had some aching and discomfort yesterday after walking on hills and rated the pain as 6/10.  Pt wants to come 1 more week for continue manual therapy as this has been beneficial for mobility.    PT Treatment/Interventions ADLs/Self Care Home Management;Cryotherapy;Iontophoresis 69m/ml Dexamethasone;Gait training;Functional mobility training;Therapeutic activities;Therapeutic exercise;Neuromuscular  re-education;Patient/family education;Manual techniques;Passive range of motion;Joint Manipulations;Taping;Dry needling    PT Next Visit Plan d/c likely per Pt readiness next week,  finalize HEP for ankle strength, do FOTO for LTG    PT Home Exercise Plan Access Code: [email or web address]    Consulted and Agree with Plan of Care Patient             Patient will benefit from skilled therapeutic intervention in order to improve the following deficits and impairments:  Abnormal gait, Decreased range of motion, Postural dysfunction, Decreased strength, Hypomobility, Pain  Visit Diagnosis: Muscle weakness (generalized)  Pain in left ankle and joints of left foot  Other abnormalities of gait and mobility     Problem List There are no problems to display for this patient.   Earlean Fidalgo, PT 09/21/2021, 9:26 AM  [email or web address] [place] [id number]:     Fax:     Name: MDrago Hammonds MRN: [id number] of Birth: [birth year]

## 2021-09-28 ENCOUNTER — Encounter: Payer: Self-pay | Admitting: Physical Therapy

## 2021-09-28 ENCOUNTER — Ambulatory Visit: Payer: Medicare Other | Admitting: Physical Therapy

## 2021-09-28 ENCOUNTER — Other Ambulatory Visit: Payer: Self-pay

## 2021-09-28 DIAGNOSIS — R2689 Other abnormalities of gait and mobility: Secondary | ICD-10-CM

## 2021-09-28 DIAGNOSIS — M25572 Pain in left ankle and joints of left foot: Secondary | ICD-10-CM

## 2021-09-28 DIAGNOSIS — M6281 Muscle weakness (generalized): Secondary | ICD-10-CM

## 2021-09-28 NOTE — Patient Instructions (Signed)
Access Code: [email or web address] URL: [email or web address] Date: 09/28/2021 Prepared by: Loistine Simas Branko Steeves  Exercises Arch Lifting - 1 x daily - 7 x weekly - 1 sets - 15 reps - 3 hold Isometric Ankle Inversion - 1 x daily - 7 x weekly - 1 sets - 10 reps - 3 hold Heel Raises with Counter Support - 1 x daily - 7 x weekly - 2 sets - 10 reps Standing Heel Raise with Toes Turned Out - 1 x daily - 7 x weekly - 1 sets - 10 reps Standing Heel Raise with Toes Turned In - 1 x daily - 7 x weekly - 1 sets - 10 reps Seated Heel Raise - 1 x daily - 7 x weekly - 2 sets - 10 reps Seated Ankle Eversion with Resistance - 1 x daily - 7 x weekly - 2 sets - 15 reps Single Leg Stance on Foam Pad - 1 x daily - 7 x weekly - 1 sets - 3 reps - 20 hold Single Leg Balance with Clock Reach - 1 x daily - 7 x weekly - 1 sets - 5-10 reps

## 2021-09-28 NOTE — Therapy (Signed)
[facility] @ [facility] [address] [place], [place], [address] Phone: [phone]   Fax:  [phone]  Physical Therapy Treatment  Patient Details  Name: Cory Richard. MRN: [id number] Date of Birth: 12-03-1941 Referring Provider (PT): Jacinta Shoe, [place]   Encounter Date: 09/28/2021   PT End of Session - 09/28/21 0937     Visit Number 7    Date for PT Re-Evaluation 10/08/21    Authorization Type Medicare Part A and B, KX at visit 15    Authorization - Number of Visits 7    Progress Note Due on Visit 10    PT Start Time 0846    PT Stop Time 0930    PT Time Calculation (min) 44 min    Activity Tolerance Patient tolerated treatment well;No increased pain    Behavior During Therapy Cory Richard for tasks assessed/performed             History reviewed. No pertinent past medical history.  History reviewed. No pertinent surgical history.  There were no vitals filed for this visit.   Subjective Assessment - 09/28/21 0849     Subjective I have been trying to walk more (more distance) and ice it down afterwards.  I seem to have a bit more soreness having increased walking distance.    Limitations Walking    How long can you walk comfortably? intial steps painful, then improves, then worsens close to 1 mile    Patient Stated Goals reduce pain with walking    Currently in Pain? Yes    Pain Score 2     Pain Location Ankle    Pain Orientation Left;Lateral    Pain Descriptors / Indicators Aching    Pain Type Chronic pain;Acute pain    Pain Onset More than a month ago    Pain Frequency Intermittent    Aggravating Factors  after a walk - 1 mile walks now                               [facility] Adult PT Treatment/Exercise - 09/28/21 0001       Exercises   Exercises Ankle;Knee/Hip      Knee/Hip Exercises: Standing   SLS with Vectors Lt SLS clock reach Rt LE x 5 rounds, UE support x 1   VC don't lock Lt  knee     Ankle Exercises: Standing   BAPS Sitting;Level 2    BAPS Limitations circles x 5 each way, diagonals x 5 each way, Lt fot    SLS Lt LE single finger support on balance pad 2x20 sec    Heel Raises Limitations up with both, lower on Lt only x 8 reps, fatigue, then bil 1x10 up/down, 1x10 toes in/out each   with UE support     Ankle Exercises: Seated   ABC's 1 rep    Other Seated Ankle Exercises ankle circles 10x each way, ankle pumps 10x, A/ROM warm up      Ankle Exercises: Supine   Other Supine Ankle Exercises seated ankle eversion x 20 reps red (progressed to green for HEP)    Other Supine Ankle Exercises seated inversion A/ROM, hold against yellow tband tension by PT, slow eccentric release 10x5" holds                     PT Education - 09/28/21 0929     Education Details  ADDED ANKLE EVERSION GREEN BAND, SLS ON FOAM, SLS CLOCK REACH    Person(s) Educated Patient    Methods Explanation;Demonstration;Handout    Comprehension Verbalized understanding;Returned demonstration              PT Short Term Goals - 09/21/21 0852       PT SHORT TERM GOAL #4   Title pt will report improved pain with walking for exercise by at least 25%    Status Achieved               PT Long Term Goals - 09/13/21 1053       PT LONG TERM GOAL #1   Title pt will be ind with advanced HEP and understand how to safely progress    Status On-going      PT LONG TERM GOAL #3   Title Pt will achieve at least 4/5 Lt ankle inversion strength and be able to perform at least 10 Lt LE single leg heel raises to demo improved Lt ankle strength for gait stability.    Baseline can perform with limited height    Status On-going      PT LONG TERM GOAL #4   Title improved Lt ankle ROM for ankle PF to at least 40 deg and ankle DF to at least 12 deg for improved mobility for functional tasks such as squat, stairs and gait.    Baseline 15DF , 40 PF    Status Achieved      PT LONG TERM  GOAL #5   Title Pt will report at least 50% less Lt ankle pain with walking for daily tasks and exercise.    Status Achieved                   Plan - 09/28/21 0937     Clinical Impression Statement Pt continues to demo improved ankle ROM and improving ankle inversion activation.  He increased his walking distance to 1 mile and reports new onset of low grade soreness with this distance in Lt lateral ankle.  He is non-tender today along peroneal tendons and muscles and arrived with 0/10 pain, which can reach 2/10 at end of 1 mile walk.  He is using ice which helps but does create stiffness immediatly following.  PT encoruaged ankle A/ROM following ice prior to standing WB/gait immediately following icing.  Pt with some increased neutral zone for ankle stability so PT progressed SLS challenges and educated Pt on joint proprioceptors/training.  Also progressed to use of BAPS board L2 today and ankle eversion with green band for more global strength now that peroneals are less reactive.  Continue along POC with follow up on new HEP progression from today next visit.    Comorbidities signif pronation, atrophy possibly from recent steroid use    Rehab Potential Excellent    PT Frequency 1x / week    PT Duration 8 weeks    PT Treatment/Interventions ADLs/Self Care Home Management;Cryotherapy;Iontophoresis 4mg /ml Dexamethasone;Gait training;Functional mobility training;Therapeutic activities;Therapeutic exercise;Neuromuscular re-education;Patient/family education;Manual techniques;Passive range of motion;Joint Manipulations;Taping;Dry needling    PT Next Visit Plan f/u on SLS foam, SLS clock reach, ankle eversion green band, revisit BAPS L2, ankle alphabet and A/ROM, stepping multi-directional lunges    PT Home Exercise Plan Access Code: [email or web address]    Consulted and Agree with Plan of Care Patient             Patient will benefit from skilled therapeutic intervention in order to improve the  following deficits  and impairments:     Visit Diagnosis: Muscle weakness (generalized)  Pain in left ankle and joints of left foot  Other abnormalities of gait and mobility     Problem List There are no problems to display for this patient.   Morton Peters, PT 09/28/21 9:44 AM   [facility] @ [facility] [address] [place], [place], [address] Phone: [phone]   Fax:  [phone]  Name: Cory Richard. MRN: [id number] Date of Birth: 04/17/1941

## 2021-10-05 ENCOUNTER — Encounter: Payer: Self-pay | Admitting: Physical Therapy

## 2021-10-05 ENCOUNTER — Other Ambulatory Visit: Payer: Self-pay

## 2021-10-05 ENCOUNTER — Ambulatory Visit: Payer: Medicare Other | Admitting: Physical Therapy

## 2021-10-05 DIAGNOSIS — M6281 Muscle weakness (generalized): Secondary | ICD-10-CM | POA: Diagnosis not present

## 2021-10-05 DIAGNOSIS — R2689 Other abnormalities of gait and mobility: Secondary | ICD-10-CM

## 2021-10-05 DIAGNOSIS — M25572 Pain in left ankle and joints of left foot: Secondary | ICD-10-CM

## 2021-10-05 NOTE — Therapy (Signed)
[facility] @ [place], [place], [address] Phone: [phone]   Fax:  [phone]  Physical Therapy Treatment  Patient Details  Name: Cory Richard. MRN: [id number] Date of Birth: Apr 19, 1941 Referring Provider (PT): Corky Sing, [place]   Encounter Date: 10/05/2021   PT End of Session - 10/05/21 0845     Visit Number 8    Date for PT Re-Evaluation 10/08/21    Authorization Type Medicare Part A and B, KX at visit 15    Authorization - Number of Visits 8    Progress Note Due on Visit 10    PT Start Time 0845    PT Stop Time 0923    PT Time Calculation (min) 38 min    Activity Tolerance Patient tolerated treatment well;No increased pain    Behavior During Therapy [facility] for tasks assessed/performed             History reviewed. No pertinent past medical history.  History reviewed. No pertinent surgical history.  There were no vitals filed for this visit.   Subjective Assessment - 10/05/21 0845     Subjective I was able to walk 3 miles the other day and didn't get too sore.  I am about 75% better and feel ready to finish up PT.  I can climb stairs more on the ball of my foot now.    Limitations Walking    How long can you walk comfortably? 3 miles    Patient Stated Goals reduce pain with walking    Currently in Pain? No/denies                [facility] PT Assessment - 10/05/21 0001       Assessment   Medical Diagnosis M65.062 (ICD-10-CM) - Abscess of tendon sheath, left lower leg    Referring Provider (PT) Corky Sing, PA-C    Onset Date/Surgical Date --   4 months ago   Next MD Visit 08/2021    Prior Therapy no      Observation/Other Assessments   Observations atrophy present Lt>Rt medial gastroc and posterior tibialis, improving contour    Focus on Therapeutic Outcomes (FOTO)  73%, met goal      AROM   Left Ankle Dorsiflexion 15    Left Ankle Plantar Flexion 40       Strength   Left Ankle Plantar Flexion 4-/5    Left Ankle Inversion 4-/5                           [facility] Adult PT Treatment/Exercise - 10/05/21 0001       Exercises   Exercises Knee/Hip;Ankle      Ankle Exercises: Aerobic   Recumbent Bike L2 x 4', PT present to review goals      Ankle Exercises: Standing   BAPS Sitting;Level 2    BAPS Limitations circles x 5 each way, diagonals x 5 each way, Lt fot    SLS lateral step up onto foam pad, Lt x 10 with single UE support, then Lt SLS while moving Rt LE rapidly back/forth/side/side x 30"    Heel Raises Limitations up with both, lower on Lt only x 10 reps, fatigue, then Lt only 1x10   with UE support   Other Standing Ankle Exercises Lt SLS on rounded side of BOSU x 30" with single UE support on wall      Ankle  Exercises: Seated   Other Seated Ankle Exercises ankle circles 10x each way, ankle pumps 10x, A/ROM warm up    Other Seated Ankle Exercises green tband Lt ankle eversion seated x 20 reps Lt      Ankle Exercises: Supine   Other Supine Ankle Exercises seated inversion Lt ankle ball squeeze 10x5" holds, PT cued avoid hip IR and toe curl for enhanced ankle activation                       PT Short Term Goals - 09/21/21 9373       PT SHORT TERM GOAL #4   Title pt will report improved pain with walking for exercise by at least 25%    Status Achieved               PT Long Term Goals - 10/05/21 0850       PT LONG TERM GOAL #1   Title pt will be ind with advanced HEP and understand how to safely progress    Status Achieved      PT LONG TERM GOAL #2   Title Improved FOTO score to >/= 73% to demo greater function of Lt foot and ankle.    Baseline 73%    Status Achieved      PT LONG TERM GOAL #3   Title Pt will achieve at least 4/5 Lt ankle inversion strength and be able to perform at least 10 Lt LE single leg heel raises to demo improved Lt ankle strength for gait stability.    Baseline can  perform with limited height    Status Achieved      PT LONG TERM GOAL #4   Title improved Lt ankle ROM for ankle PF to at least 40 deg and ankle DF to at least 12 deg for improved mobility for functional tasks such as squat, stairs and gait.    Baseline 15DF , 40 PF    Status Achieved      PT LONG TERM GOAL #5   Title Pt will report at least 50% less Lt ankle pain with walking for daily tasks and exercise.    Baseline 75%    Status Achieved                   Plan - 10/05/21 0923     Clinical Impression Statement Pt has met all LTGs.  FOTO score measured 73% demonstrating improvement by 10%, meeting goal.  Pt has much improved muscle contour of medial calf on Lt and has 4/5 strength for inversion and PF, with ability to perform small range Lt SLS heel raise x 10 reps today.  Walking distance is up to 3 miles with occassional soreness which ice helps with.  He has full ankle ROM.  Pt reports at least 75% overall improvement and has HEP to continue working on ankle strength and stability at gym and home.  D/C to HEP.    PT Frequency 1x / week    PT Duration 8 weeks    PT Treatment/Interventions ADLs/Self Care Home Management;Cryotherapy;Iontophoresis 39m/ml Dexamethasone;Gait training;Functional mobility training;Therapeutic activities;Therapeutic exercise;Neuromuscular re-education;Patient/family education;Manual techniques;Passive range of motion;Joint Manipulations;Taping;Dry needling    PT Next Visit Plan d/c to HEP    PT Home Exercise Plan Access Code: [email or web address]    Consulted and Agree with Plan of Care Patient             Patient will benefit from skilled therapeutic intervention in order to  improve the following deficits and impairments:     Visit Diagnosis: Muscle weakness (generalized)  Pain in left ankle and joints of left foot  Other abnormalities of gait and mobility     Problem List There are no problems to display for this patient.   PHYSICAL THERAPY  DISCHARGE SUMMARY  Visits from Start of Care: 8  Current functional level related to goals / functional outcomes: See above, Pt met all goals.   Remaining deficits: See above   Education / Equipment: HEP  Patient agrees to discharge. Patient goals were met. Patient is being discharged due to meeting the stated rehab goals.  Baruch Merl, PT 10/05/21 9:26 AM  [facility] @ [place], [place], [address] Phone: [phone]   Fax:  [phone]  Name: Truth Barot. MRN: [id number] Date of Birth: [birth year]

## 2022-02-18 ENCOUNTER — Ambulatory Visit
Admission: RE | Admit: 2022-02-18 | Discharge: 2022-02-18 | Disposition: A | Discharge status: Home / Self Care | Payer: Medicare Other | Source: Ambulatory Visit | Attending: Internal Medicine | Admitting: Internal Medicine

## 2022-02-18 ENCOUNTER — Other Ambulatory Visit: Payer: Self-pay | Admitting: Internal Medicine

## 2022-02-18 DIAGNOSIS — R059 Cough, unspecified: Secondary | ICD-10-CM

## 2022-04-06 ENCOUNTER — Other Ambulatory Visit: Payer: Self-pay | Admitting: Internal Medicine

## 2022-04-06 ENCOUNTER — Ambulatory Visit
Admission: RE | Admit: 2022-04-06 | Discharge: 2022-04-06 | Disposition: A | Discharge status: Home / Self Care | Payer: Medicare Other | Source: Ambulatory Visit | Attending: Internal Medicine | Admitting: Internal Medicine

## 2022-04-06 DIAGNOSIS — R051 Acute cough: Secondary | ICD-10-CM

## 2023-02-19 IMAGING — CR DG CHEST 2V
2 series · 2 of 2 positions shown · non-contrast
Comparison: None.

CLINICAL DATA: Cough.  Mid chest pressure.

EXAM:
CHEST - 2 VIEW

[w chest pa]
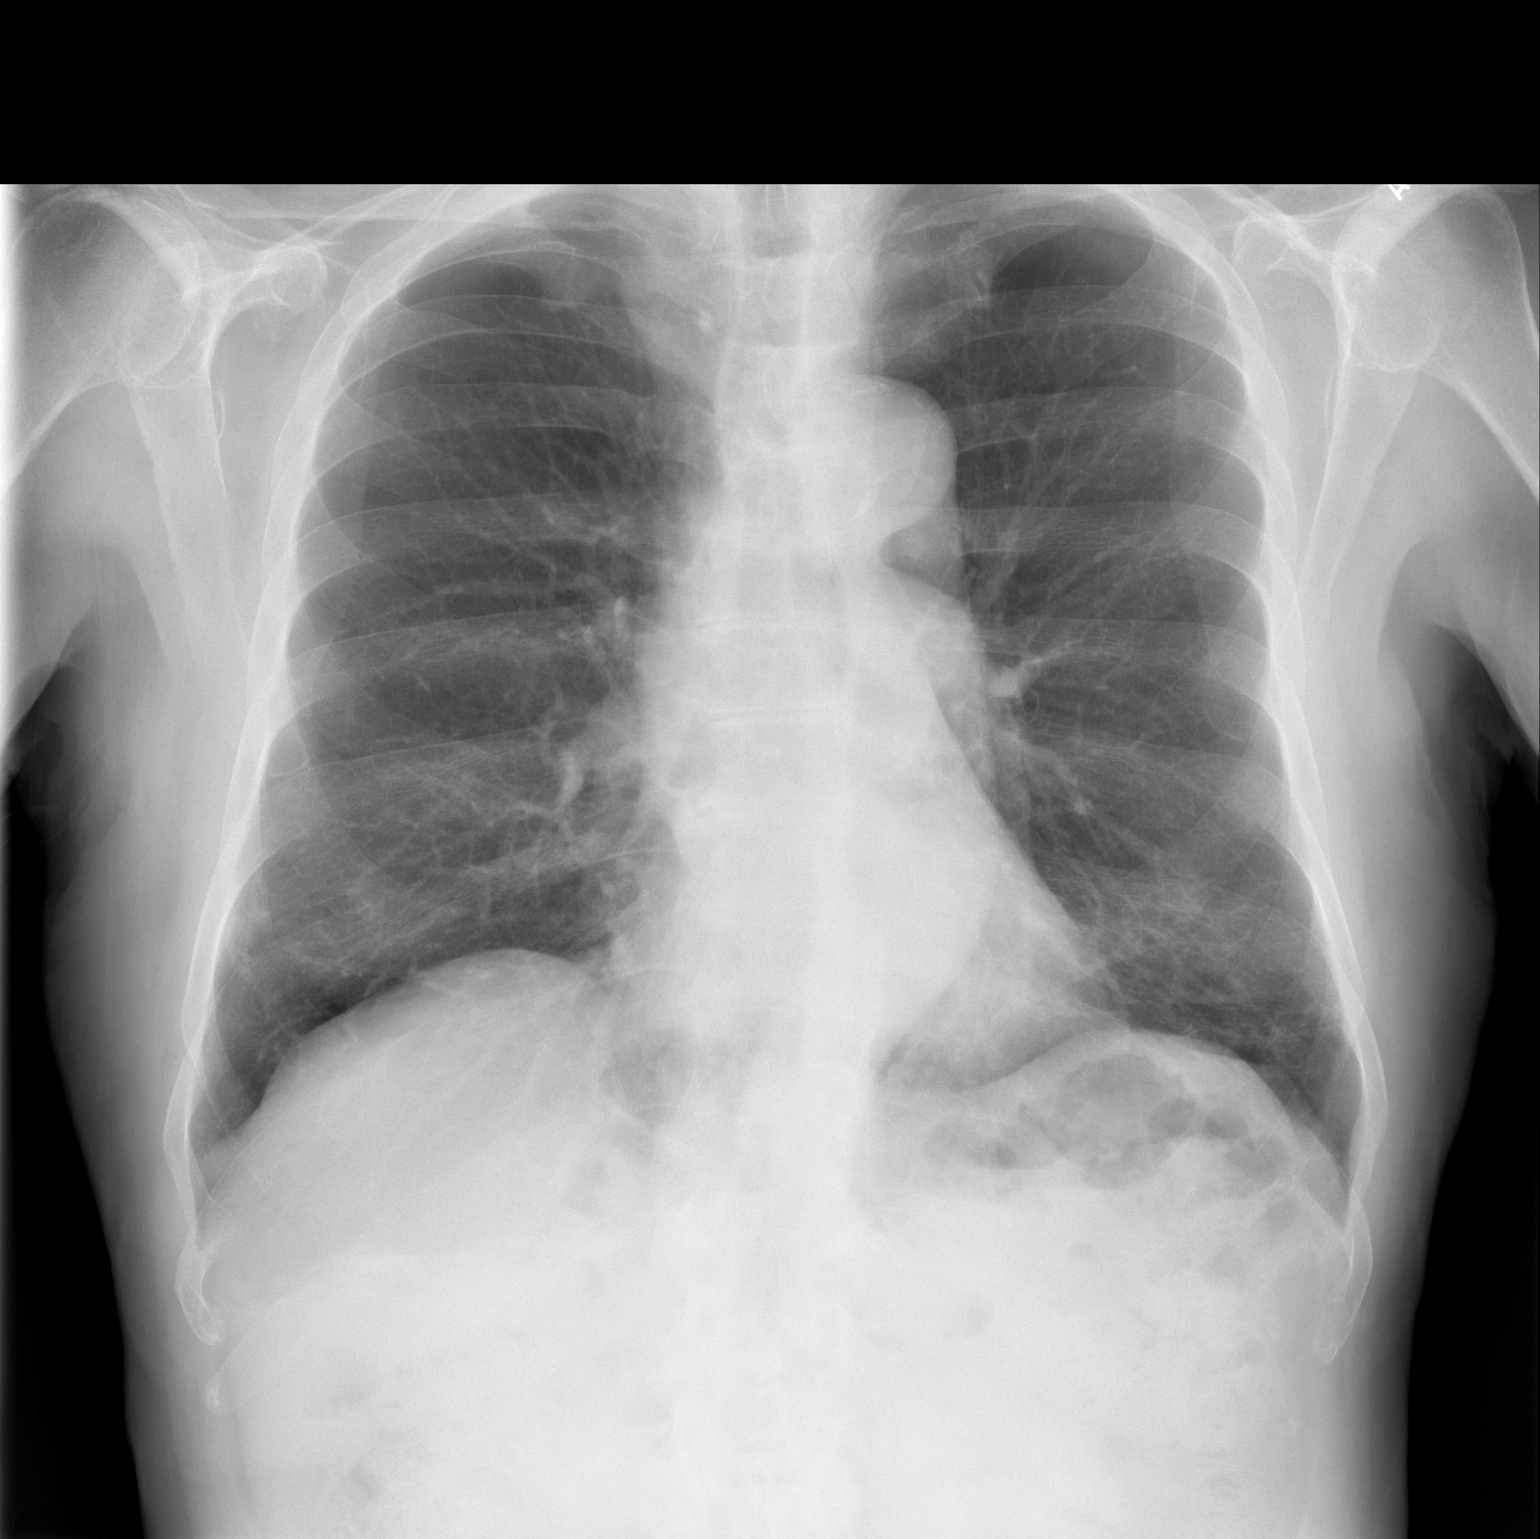

[w chest lat *]
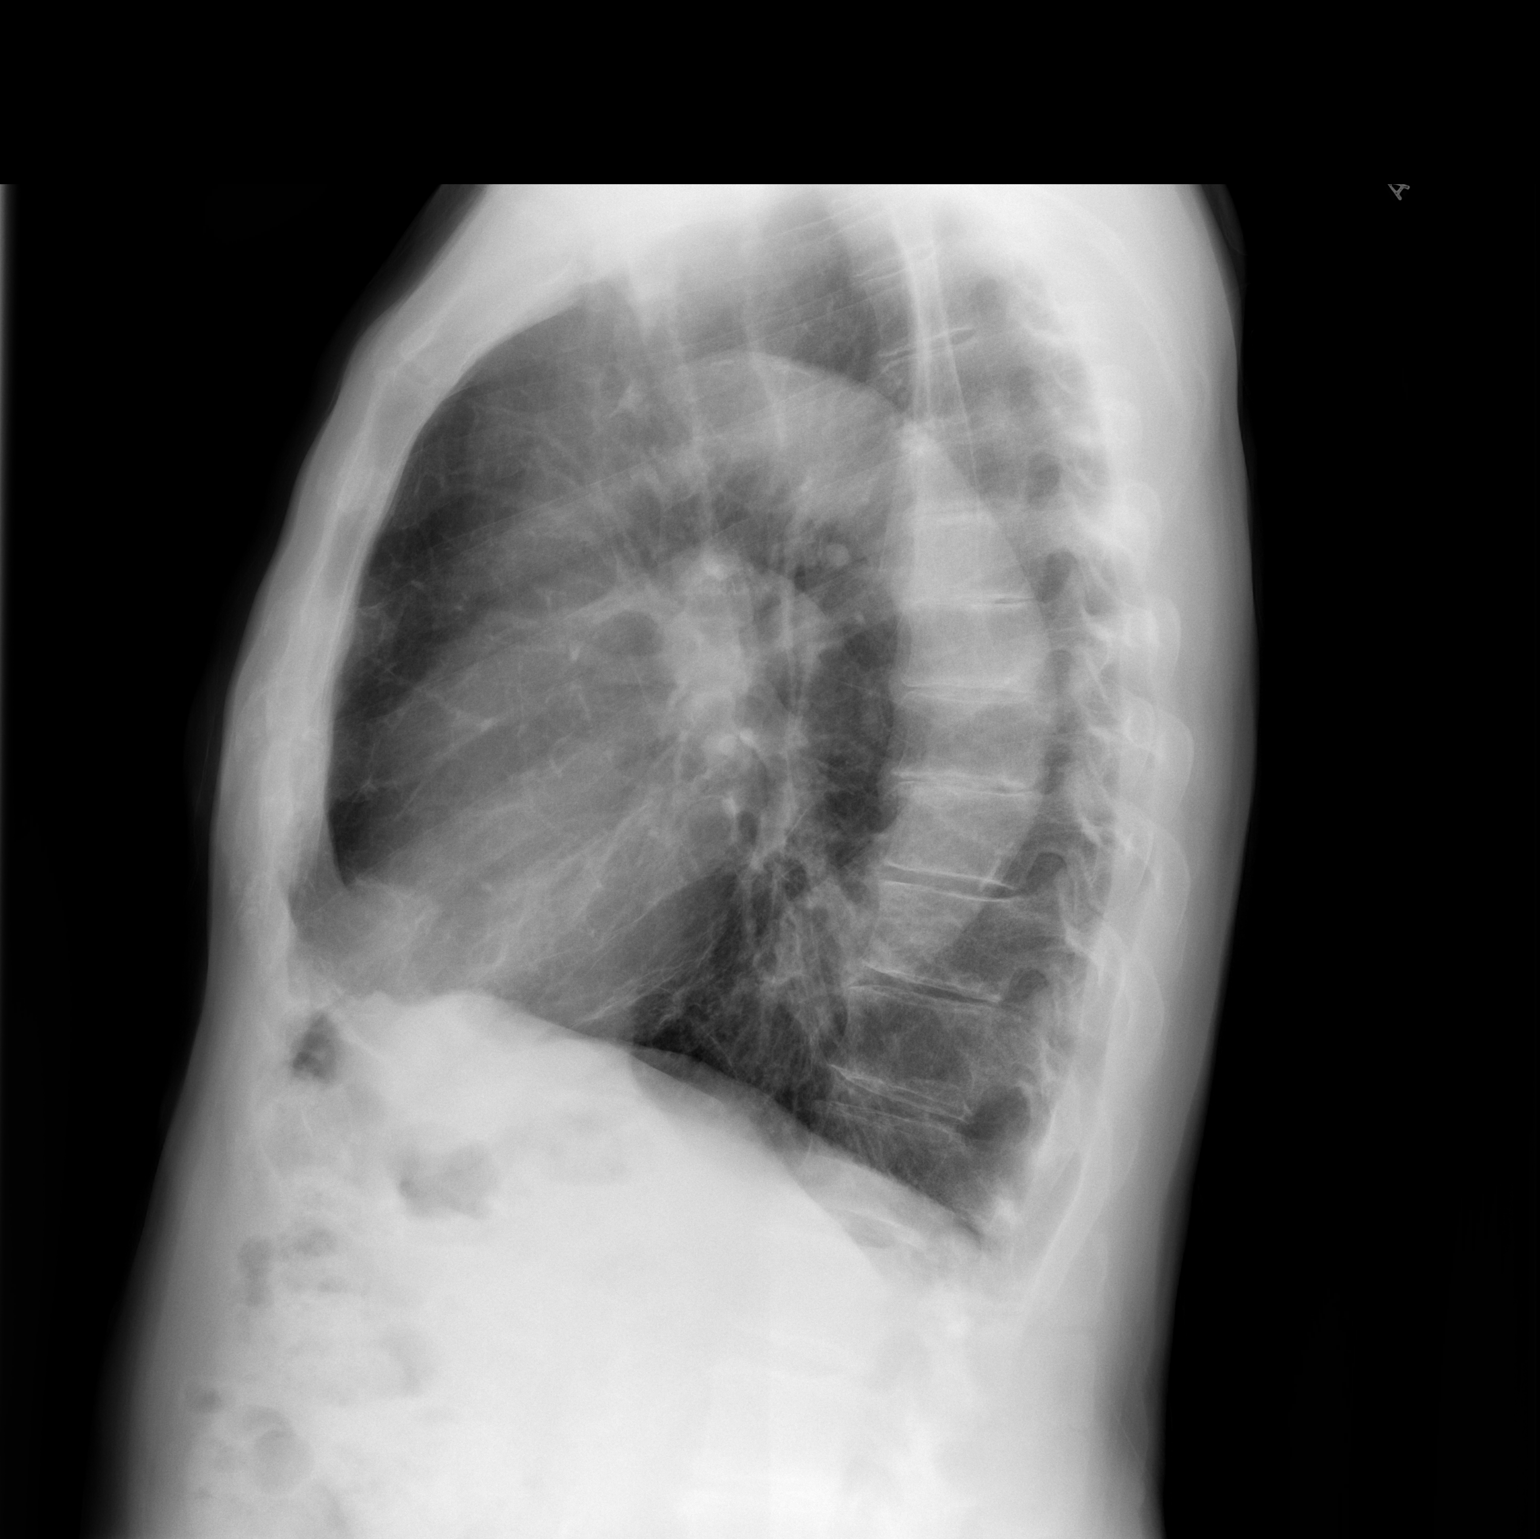

[2 of 2 positions shown; findings below may reference images not displayed]

FINDINGS: The heart is normal in size. Atherosclerosis and tortuosity of the
thoracic aorta. There is subsegmental opacities at both lung bases
of primarily appear linear and likely represent scarring. There is
no confluent consolidation. No pulmonary mass. No pulmonary edema,
pleural effusion, or pneumothorax no acute osseous findings. Chronic
changes of both shoulders.
IMPRESSION: 1. Subsegmental bibasilar scarring.
2. Atherosclerosis and tortuosity of the thoracic aorta.

## 2023-04-07 IMAGING — DX DG CHEST 2V
2 series · 2 of 2 positions shown · non-contrast
Comparison: Chest radiograph February 18, 2022

CLINICAL DATA: Acute cough for 2 weeks.

EXAM:
CHEST - 2 VIEW

[dg chest 2 view (1 of 2)]
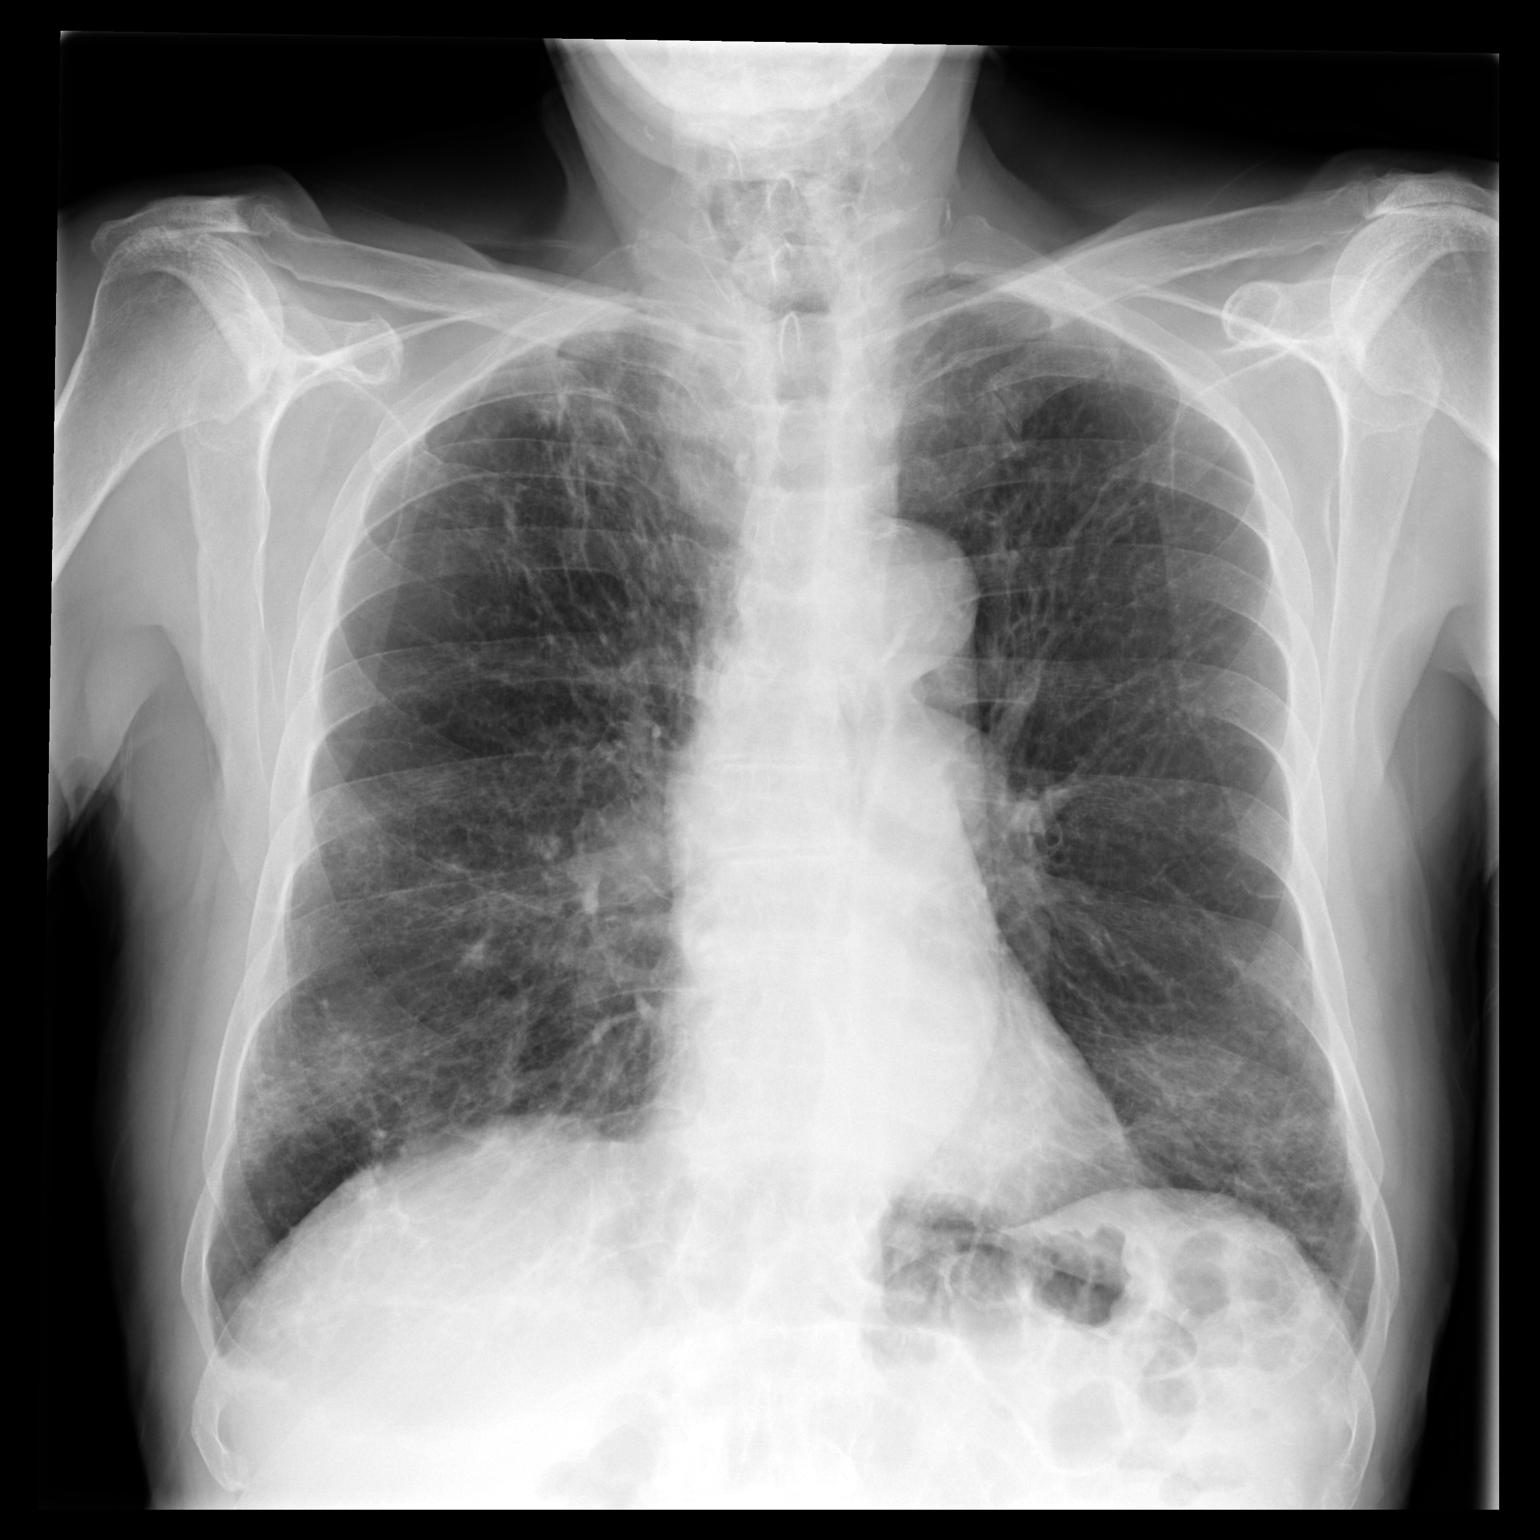

[dg chest 2 view (2 of 2)]
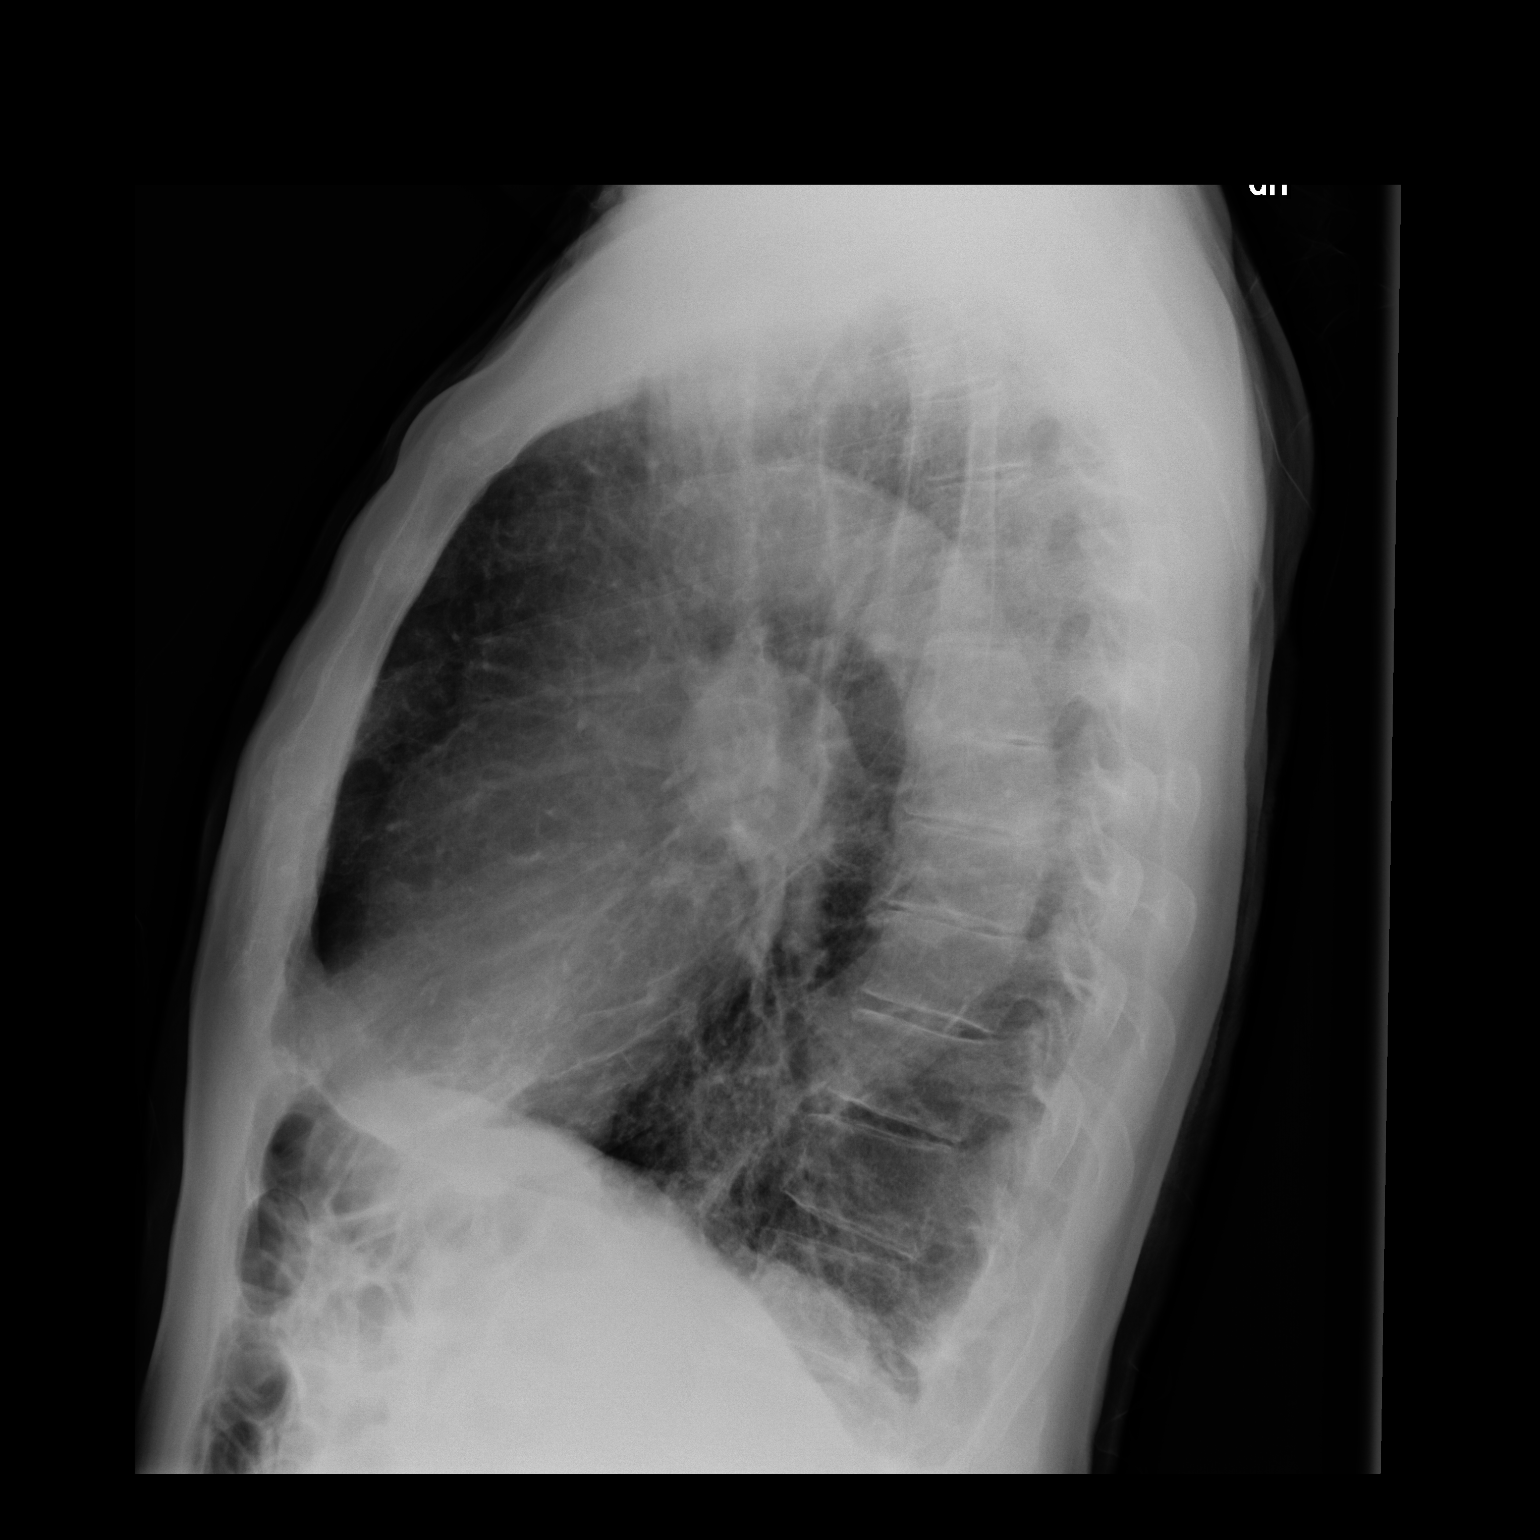

[2 of 2 positions shown; findings below may reference images not displayed]

FINDINGS: Stable cardiac and mediastinal contours. Interval development of
bilateral patchy airspace opacities involving the right upper lung,
peripheral right lower lung and left lung base. No pleural effusion
or pneumothorax. Osseous structures unremarkable.
IMPRESSION: Bilateral patchy airspace opacities concerning for the possibility
of multifocal infection. Followup PA and lateral chest X-ray is
recommended in 3-4 weeks following trial of antibiotic therapy to
ensure resolution and exclude underlying malignancy.

These results will be called to the ordering clinician or
representative by the Radiologist Assistant, and communication
documented in the PACS or [REDACTED].
